# Patient Record
Sex: Female | Born: 1945 | Race: White | Hispanic: No | Marital: Married | Smoking: Never smoker
Health system: Southern US, Community
[De-identification: ages and names within clinical notes are randomized; demographics above are authoritative.]

## PROBLEM LIST (undated history)

## (undated) DIAGNOSIS — D3912 Neoplasm of uncertain behavior of left ovary: Secondary | ICD-10-CM

## (undated) DIAGNOSIS — Z961 Presence of intraocular lens: Secondary | ICD-10-CM

## (undated) DIAGNOSIS — C801 Malignant (primary) neoplasm, unspecified: Secondary | ICD-10-CM

## (undated) DIAGNOSIS — Z872 Personal history of diseases of the skin and subcutaneous tissue: Secondary | ICD-10-CM

## (undated) DIAGNOSIS — M159 Polyosteoarthritis, unspecified: Secondary | ICD-10-CM

## (undated) DIAGNOSIS — M8580 Other specified disorders of bone density and structure, unspecified site: Secondary | ICD-10-CM

## (undated) HISTORY — DX: Personal history of diseases of the skin and subcutaneous tissue: Z87.2

## (undated) HISTORY — PX: COLONOSCOPY: SHX174

## (undated) HISTORY — PX: ABDOMINAL HYSTERECTOMY: SHX81

## (undated) HISTORY — PX: BREAST EXCISIONAL BIOPSY: SUR124

## (undated) HISTORY — PX: CATARACT EXTRACTION: SUR2

---

## 2004-12-31 ENCOUNTER — Ambulatory Visit: Payer: Self-pay | Admitting: Gastroenterology

## 2005-05-28 ENCOUNTER — Ambulatory Visit: Payer: Self-pay | Admitting: Internal Medicine

## 2005-07-22 ENCOUNTER — Ambulatory Visit: Payer: Self-pay | Admitting: Internal Medicine

## 2006-07-23 ENCOUNTER — Ambulatory Visit: Payer: Self-pay | Admitting: Internal Medicine

## 2007-07-28 ENCOUNTER — Ambulatory Visit: Payer: Self-pay | Admitting: Internal Medicine

## 2007-12-30 DIAGNOSIS — C801 Malignant (primary) neoplasm, unspecified: Secondary | ICD-10-CM

## 2007-12-30 HISTORY — DX: Malignant (primary) neoplasm, unspecified: C80.1

## 2008-04-17 ENCOUNTER — Inpatient Hospital Stay: Payer: Self-pay | Admitting: Surgery

## 2008-04-26 DIAGNOSIS — D3910 Neoplasm of uncertain behavior of unspecified ovary: Secondary | ICD-10-CM | POA: Insufficient documentation

## 2008-08-21 ENCOUNTER — Ambulatory Visit: Payer: Self-pay | Admitting: Unknown Physician Specialty

## 2008-09-07 ENCOUNTER — Ambulatory Visit: Payer: Self-pay | Admitting: Internal Medicine

## 2009-09-11 ENCOUNTER — Ambulatory Visit: Payer: Self-pay | Admitting: Internal Medicine

## 2010-09-18 ENCOUNTER — Ambulatory Visit: Payer: Self-pay | Admitting: Internal Medicine

## 2011-07-18 ENCOUNTER — Ambulatory Visit: Payer: Self-pay | Admitting: Internal Medicine

## 2011-09-22 ENCOUNTER — Ambulatory Visit: Payer: Self-pay | Admitting: Internal Medicine

## 2012-09-22 ENCOUNTER — Ambulatory Visit: Payer: Self-pay | Admitting: Internal Medicine

## 2012-12-29 HISTORY — PX: BREAST BIOPSY: SHX20

## 2013-10-04 ENCOUNTER — Ambulatory Visit: Payer: Self-pay | Admitting: Internal Medicine

## 2013-10-10 ENCOUNTER — Ambulatory Visit: Payer: Self-pay | Admitting: Internal Medicine

## 2013-10-17 ENCOUNTER — Ambulatory Visit: Payer: Self-pay | Admitting: Internal Medicine

## 2014-07-15 DIAGNOSIS — M8580 Other specified disorders of bone density and structure, unspecified site: Secondary | ICD-10-CM | POA: Insufficient documentation

## 2014-10-09 ENCOUNTER — Ambulatory Visit: Payer: Self-pay | Admitting: Internal Medicine

## 2015-01-12 IMAGING — MG MM DIGITAL SCREENING BILAT W/ CAD
1 series · 4 of 4 positions shown · non-contrast
Comparison: Previous Exam(s)

REASON FOR EXAM: SCR MAMMO NO ORDER
COMMENTS:

PROCEDURE:     MAM - MAM DGTL SCRN MAM NO ORDER W/CAD  - October 04, 2013  [DATE]
CLINICAL DATA: Screening.
EXAM:
DIGITAL SCREENING BILATERAL MAMMOGRAM WITH CAD

[R CC · right · 4 of 4 slices shown]
[im 1/4]
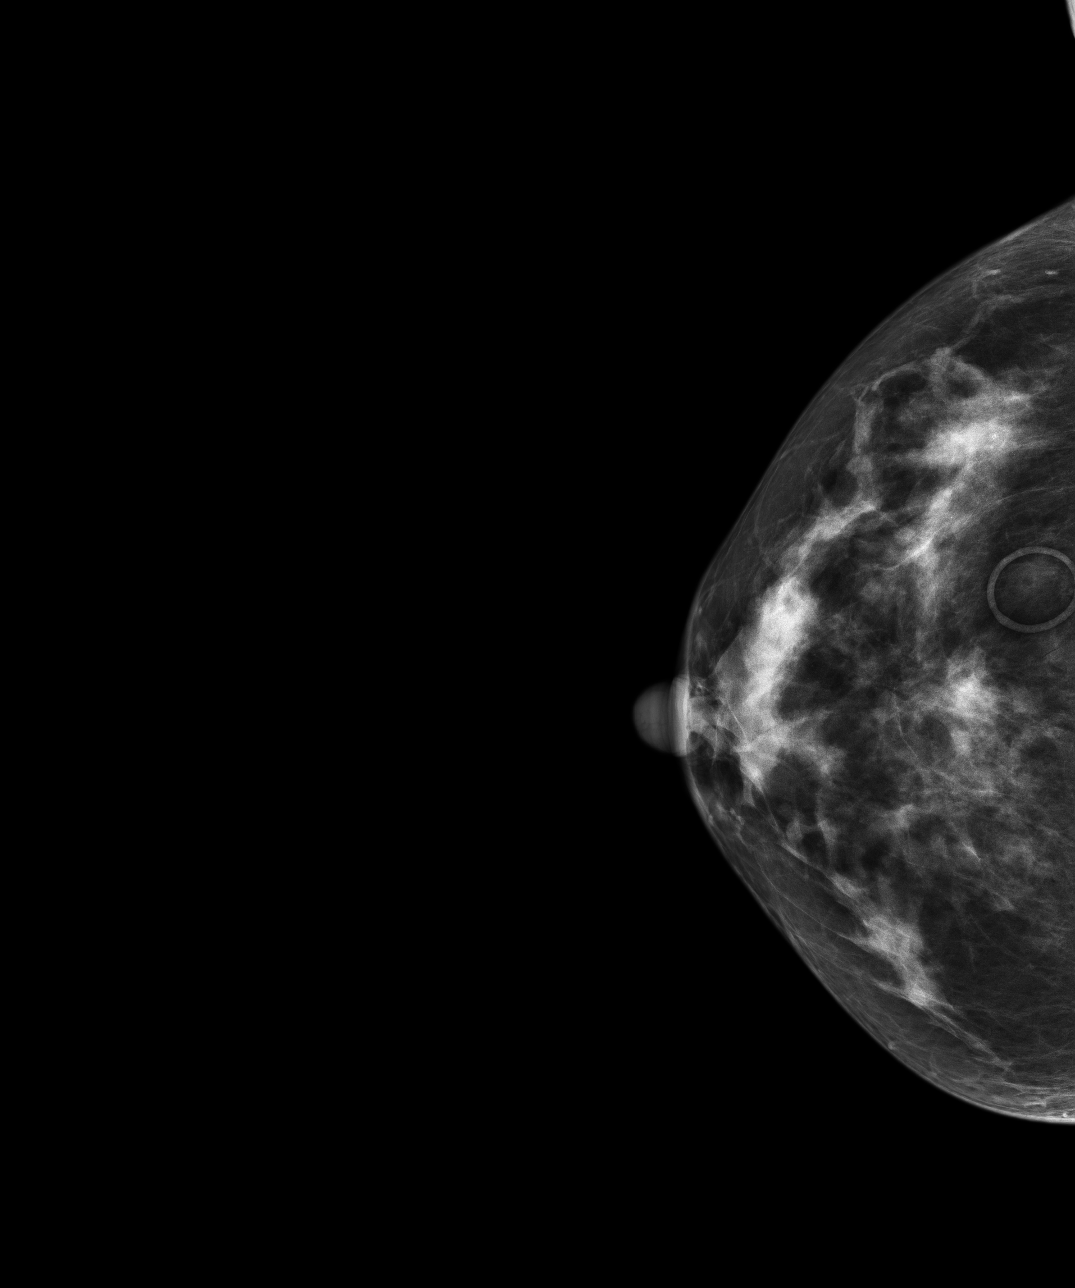
[im 2/4]
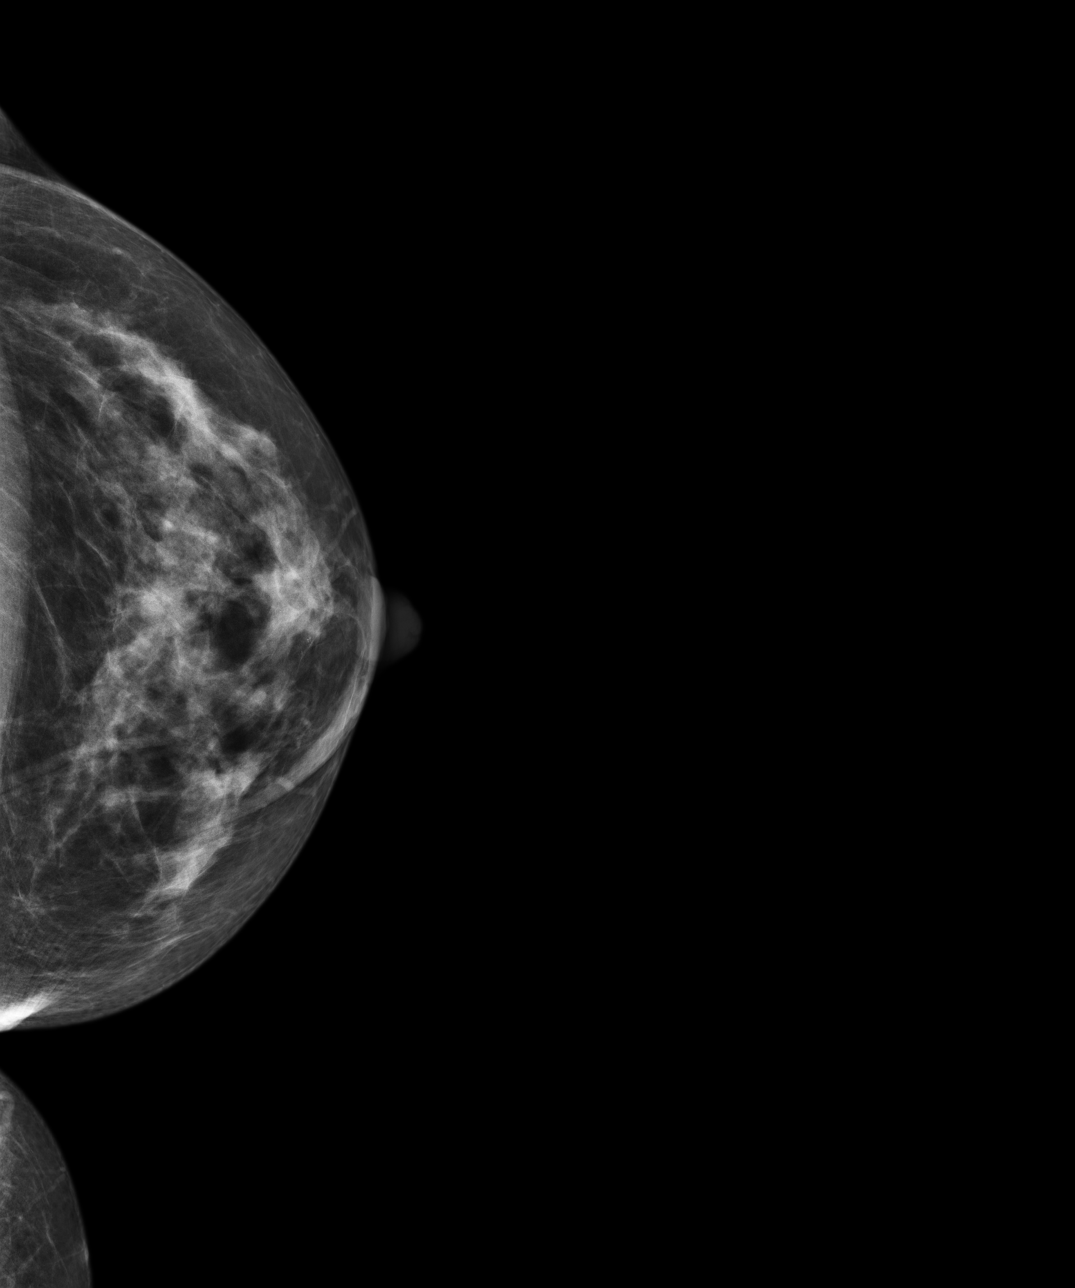
[im 3/4]
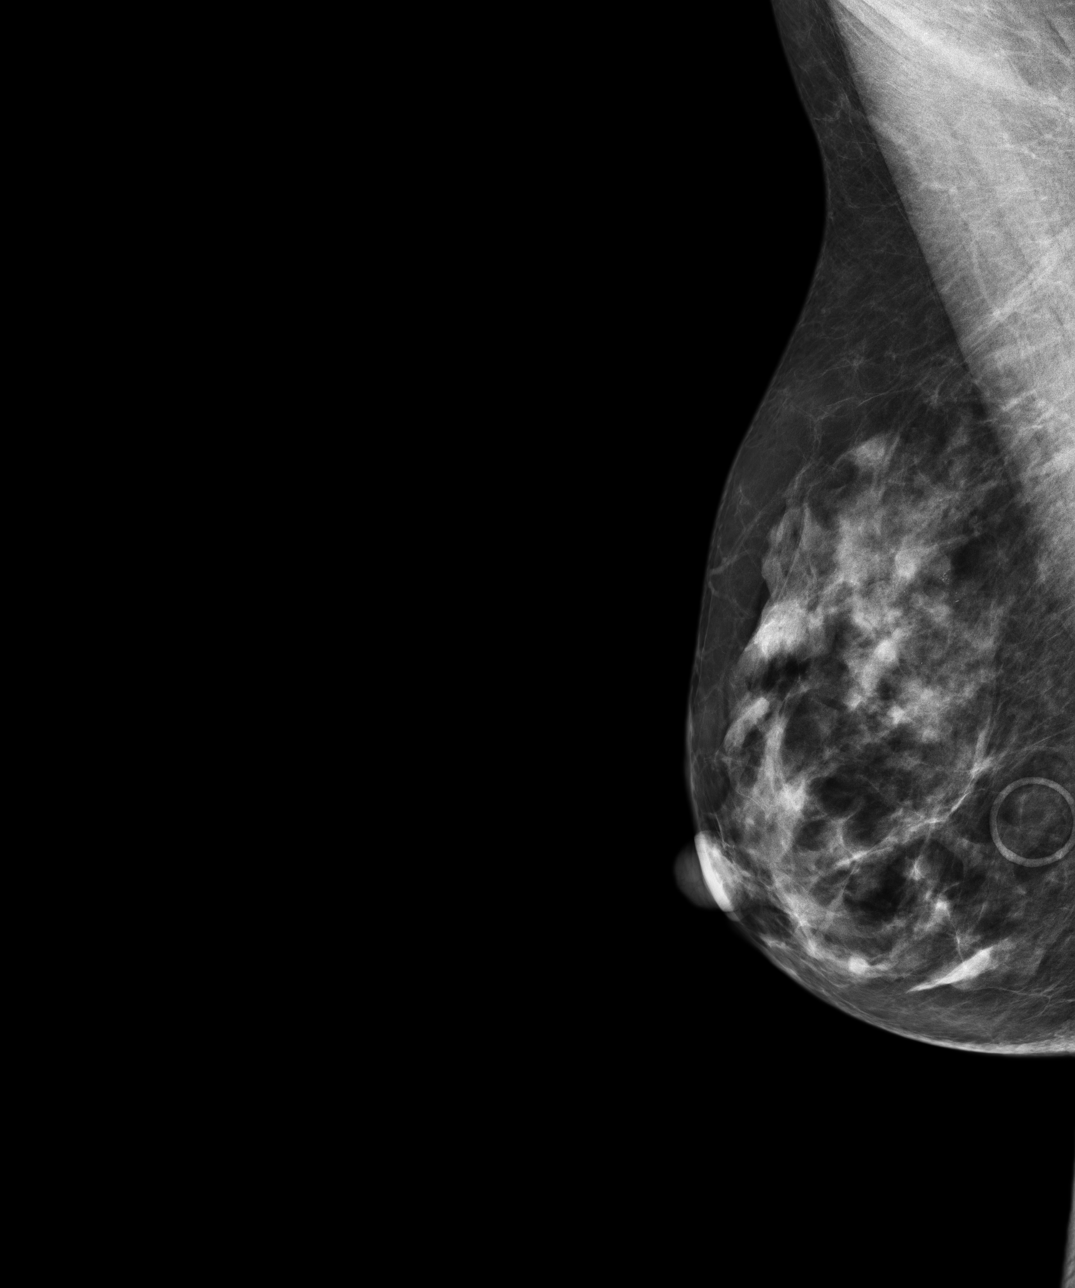
[im 4/4]
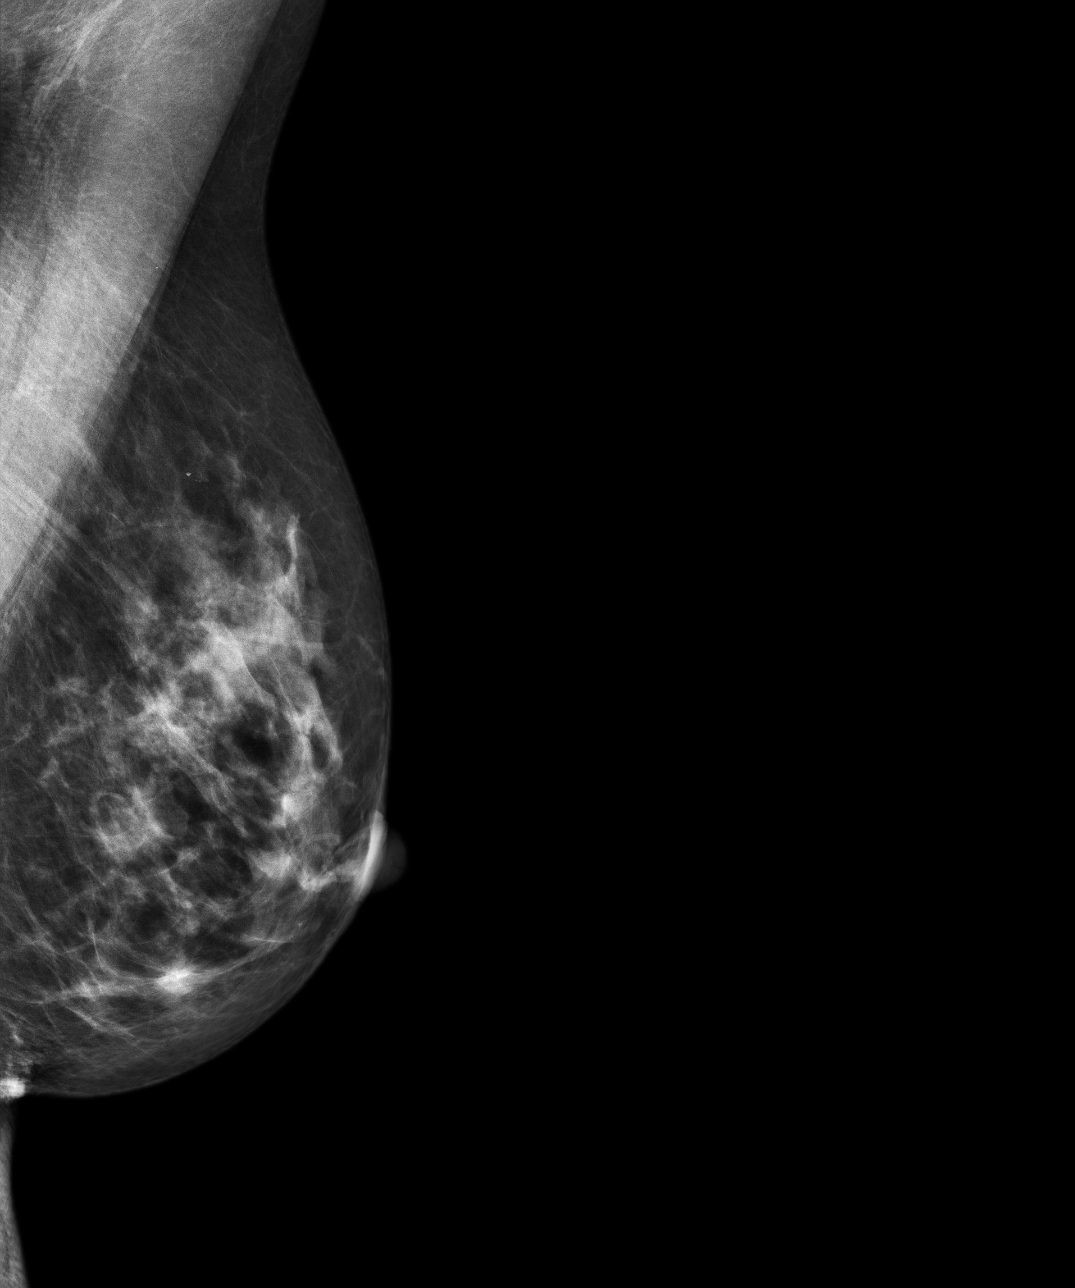

[4 of 4 positions shown; findings below may reference images not displayed]

ACR Breast Density Category c: The breasts are heterogeneously
dense, which may obscure small masses.
FINDINGS: In the right breast, calcifications warrant further evaluation with
magnified views. In the left breast, no mass or malignant type
calcifications are identified. Images were processed with CAD.
IMPRESSION: Further evaluation is suggested for calcifications in the right
breast.

RECOMMENDATION:
Diagnostic mammogram of the right breast. (Code:U5-5-LLH)

The patient will be contacted regarding the findings, and additional
imaging will be scheduled.

BI-RADS CATEGORY  0: Incomplete. Need additional imaging evaluation
and/or prior mammograms for comparison.

## 2015-01-18 IMAGING — MG MM ADDITIONAL VIEWS AT NO CHARGE
1 series · 2 of 2 positions shown · non-contrast
Comparison: 10/04/2013

REASON FOR EXAM: av rt calcifications
COMMENTS:

PROCEDURE:     MAM - MAM DIG ADDVIEWS RT SCR  - October 10, 2013  [DATE]
CLINICAL DATA: Calcifications right breast identified on recent
screening mammogram.
EXAM:
DIGITAL DIAGNOSTIC  BILATERAL MAMMOGRAM without CAD

[R CC · right · 2 of 2 slices shown]
[im 1/2]
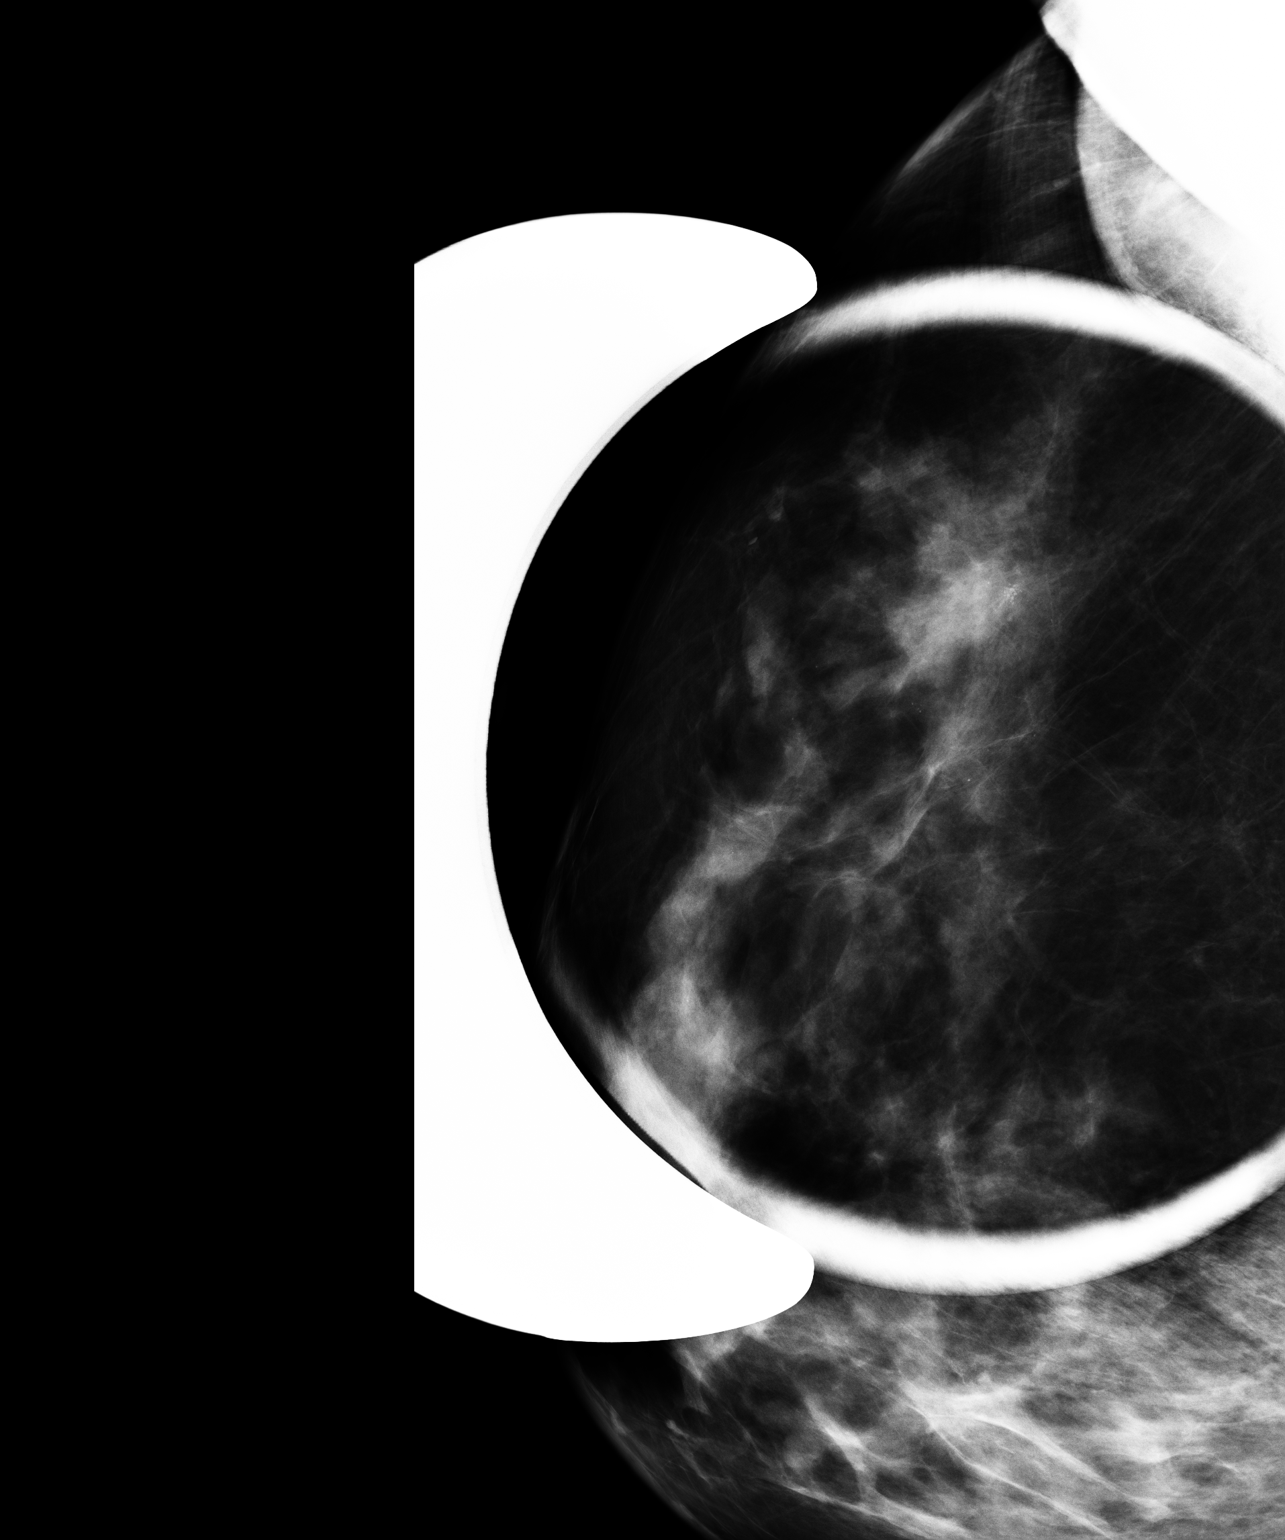
[im 2/2]
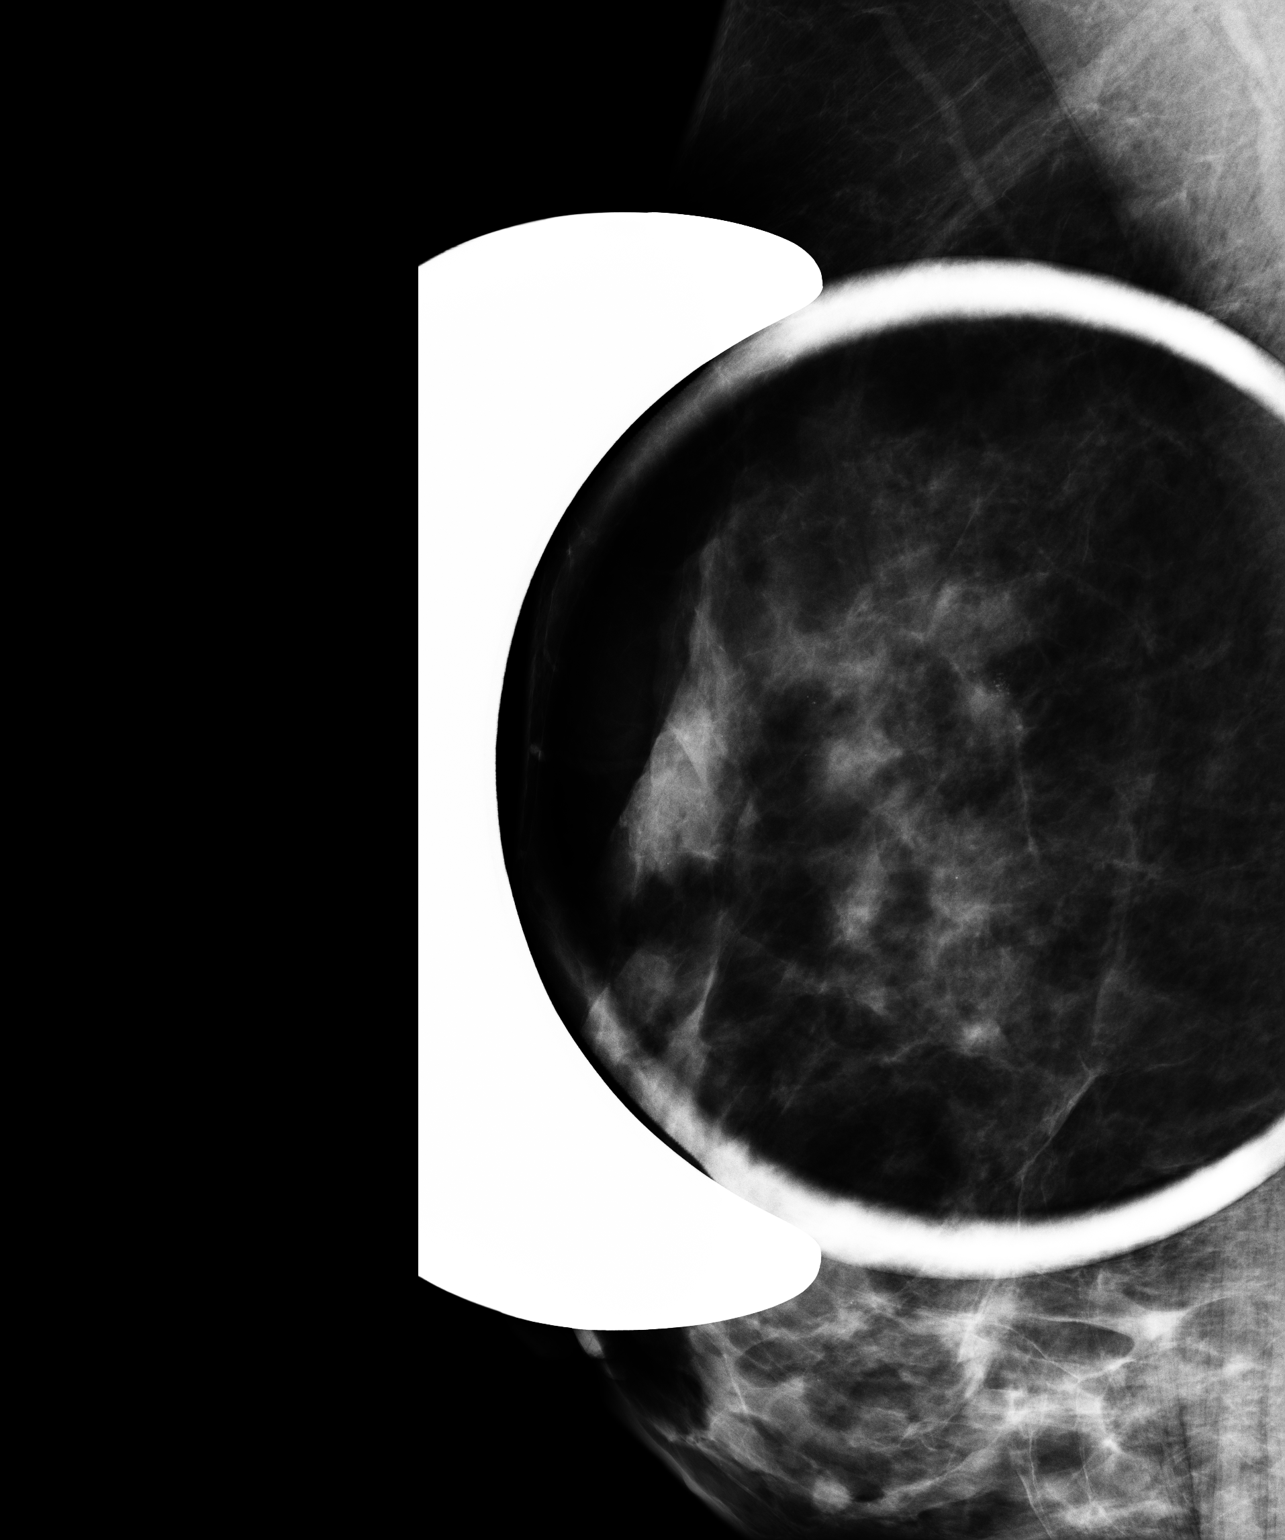

[2 of 2 positions shown; findings below may reference images not displayed]

ACR Breast Density Category c: The breasts are heterogeneously
dense, which may obscure small masses.
FINDINGS: Magnification views of the upper outer right breast show a tight
group of punctate, heterogeneous calcifications that span 3 x 4 x 3
mm.
IMPRESSION: 4 x 3 x 3 mm group of heterogeneous calcifications in the upper
outer right breast. Malignancy cannot be excluded. Stereotactic
biopsy is suggested.

RECOMMENDATION:
Stereotactic biopsy of the right breast. This was discussed with the
patient and was called to the physician's office by Fayad. Biopsy
has been scheduled for 10/17/2013 at 4 o'clock p.m.

I have discussed the findings and recommendations with the patient.
Results were also provided in writing at the conclusion of the
visit. If applicable, a reminder letter will be sent to the patient
regarding the next appointment.

BI-RADS CATEGORY  4: Suspicious abnormality - biopsy should be
considered.

## 2015-08-01 ENCOUNTER — Other Ambulatory Visit: Payer: Self-pay | Admitting: Internal Medicine

## 2015-08-01 DIAGNOSIS — Z1231 Encounter for screening mammogram for malignant neoplasm of breast: Secondary | ICD-10-CM

## 2015-10-15 ENCOUNTER — Ambulatory Visit
Admission: RE | Admit: 2015-10-15 | Discharge: 2015-10-15 | Disposition: A | Discharge status: Home / Self Care | Payer: BLUE CROSS/BLUE SHIELD | Source: Ambulatory Visit | Attending: Internal Medicine | Admitting: Internal Medicine

## 2015-10-15 DIAGNOSIS — Z1231 Encounter for screening mammogram for malignant neoplasm of breast: Secondary | ICD-10-CM | POA: Diagnosis present

## 2015-10-15 HISTORY — DX: Malignant (primary) neoplasm, unspecified: C80.1

## 2016-04-28 DIAGNOSIS — Z961 Presence of intraocular lens: Secondary | ICD-10-CM | POA: Insufficient documentation

## 2016-05-19 DIAGNOSIS — Z961 Presence of intraocular lens: Secondary | ICD-10-CM | POA: Insufficient documentation

## 2016-11-18 ENCOUNTER — Inpatient Hospital Stay
Admission: EM | Admit: 2016-11-18 | Discharge: 2016-11-23 | DRG: 419 | Disposition: A | Discharge status: Home / Self Care | Payer: BLUE CROSS/BLUE SHIELD | Attending: Surgery | Admitting: Surgery

## 2016-11-18 ENCOUNTER — Emergency Department: Payer: BLUE CROSS/BLUE SHIELD

## 2016-11-18 DIAGNOSIS — Z90710 Acquired absence of both cervix and uterus: Secondary | ICD-10-CM

## 2016-11-18 DIAGNOSIS — K819 Cholecystitis, unspecified: Secondary | ICD-10-CM

## 2016-11-18 DIAGNOSIS — K810 Acute cholecystitis: Secondary | ICD-10-CM | POA: Diagnosis not present

## 2016-11-18 DIAGNOSIS — K8062 Calculus of gallbladder and bile duct with acute cholecystitis without obstruction: Principal | ICD-10-CM | POA: Diagnosis present

## 2016-11-18 DIAGNOSIS — K838 Other specified diseases of biliary tract: Secondary | ICD-10-CM

## 2016-11-18 DIAGNOSIS — Z8543 Personal history of malignant neoplasm of ovary: Secondary | ICD-10-CM

## 2016-11-18 DIAGNOSIS — R109 Unspecified abdominal pain: Secondary | ICD-10-CM

## 2016-11-18 DIAGNOSIS — K9189 Other postprocedural complications and disorders of digestive system: Secondary | ICD-10-CM

## 2016-11-18 LAB — COMPREHENSIVE METABOLIC PANEL
ALK PHOS: 66 U/L (ref 38–126)
ALT: 68 U/L — ABNORMAL HIGH (ref 14–54)
ANION GAP: 10 (ref 5–15)
AST: 83 U/L — ABNORMAL HIGH (ref 15–41)
Albumin: 3.9 g/dL (ref 3.5–5.0)
BILIRUBIN TOTAL: 1.3 mg/dL — AB (ref 0.3–1.2)
BUN: 17 mg/dL (ref 6–20)
CALCIUM: 9.3 mg/dL (ref 8.9–10.3)
CO2: 23 mmol/L (ref 22–32)
Chloride: 98 mmol/L — ABNORMAL LOW (ref 101–111)
Creatinine, Ser: 0.68 mg/dL (ref 0.44–1.00)
GFR calc non Af Amer: >60 mL/min (ref >60)
Glucose, Bld: 131 mg/dL — ABNORMAL HIGH (ref 65–99)
Potassium: 3.5 mmol/L (ref 3.5–5.1)
Sodium: 131 mmol/L — ABNORMAL LOW (ref 135–145)
TOTAL PROTEIN: 7.5 g/dL (ref 6.5–8.1)

## 2016-11-18 LAB — URINALYSIS COMPLETE WITH MICROSCOPIC (ARMC ONLY)
BILIRUBIN URINE: NEGATIVE
Bacteria, UA: NONE SEEN
Glucose, UA: 50 mg/dL — AB
KETONES UR: NEGATIVE mg/dL
NITRITE: NEGATIVE
PH: 5 (ref 5.0–8.0)
Protein, ur: 30 mg/dL — AB
Specific Gravity, Urine: 1.021 (ref 1.005–1.030)

## 2016-11-18 LAB — CBC
HCT: 44 % (ref 35.0–47.0)
HEMOGLOBIN: 15.7 g/dL (ref 12.0–16.0)
MCH: 31.8 pg (ref 26.0–34.0)
MCHC: 35.7 g/dL (ref 32.0–36.0)
MCV: 89 fL (ref 80.0–100.0)
Platelets: 193 10*3/uL (ref 150–440)
RBC: 4.95 MIL/uL (ref 3.80–5.20)
RDW: 13.3 % (ref 11.5–14.5)
WBC: 32.8 10*3/uL — AB (ref 3.6–11.0)

## 2016-11-18 LAB — SURGICAL PCR SCREEN
MRSA, PCR: NEGATIVE
Staphylococcus aureus: NEGATIVE

## 2016-11-18 LAB — LIPASE, BLOOD: Lipase: 12 U/L (ref 11–51)

## 2016-11-18 MED ORDER — ONDANSETRON HCL 4 MG/2ML IJ SOLN
4.0000 mg | Freq: Once | INTRAMUSCULAR | Status: AC
  Administered 2016-11-18: 4 mg via INTRAVENOUS
  Filled 2016-11-18: qty 2

## 2016-11-18 MED ORDER — HYDROMORPHONE HCL 1 MG/ML IJ SOLN
0.5000 mg | Freq: Once | INTRAMUSCULAR | Status: AC
  Administered 2016-11-18: 0.5 mg via INTRAVENOUS
  Filled 2016-11-18: qty 1

## 2016-11-18 MED ORDER — MORPHINE SULFATE (PF) 4 MG/ML IV SOLN
2.0000 mg | INTRAVENOUS | Status: DC | PRN
  Administered 2016-11-18 – 2016-11-20 (×10): 2 mg via INTRAVENOUS
  Filled 2016-11-18 (×10): qty 1

## 2016-11-18 MED ORDER — PIPERACILLIN-TAZOBACTAM 3.375 G IVPB 30 MIN
3.3750 g | Freq: Once | INTRAVENOUS | Status: AC
  Administered 2016-11-18: 3.375 g via INTRAVENOUS
  Filled 2016-11-18: qty 50

## 2016-11-18 MED ORDER — IOPAMIDOL (ISOVUE-300) INJECTION 61%
30.0000 mL | Freq: Once | INTRAVENOUS | Status: AC | PRN
  Administered 2016-11-18: 30 mL via ORAL

## 2016-11-18 MED ORDER — DEXTROSE-NACL 5-0.9 % IV SOLN
INTRAVENOUS | Status: DC
  Administered 2016-11-18 – 2016-11-19 (×3): via INTRAVENOUS

## 2016-11-18 MED ORDER — HEPARIN SODIUM (PORCINE) 5000 UNIT/ML IJ SOLN
5000.0000 [IU] | Freq: Three times a day (TID) | INTRAMUSCULAR | Status: DC
  Administered 2016-11-18 – 2016-11-23 (×13): 5000 [IU] via SUBCUTANEOUS
  Filled 2016-11-18 (×13): qty 1

## 2016-11-18 MED ORDER — MORPHINE SULFATE (PF) 4 MG/ML IV SOLN
4.0000 mg | Freq: Once | INTRAVENOUS | Status: AC
  Administered 2016-11-18: 4 mg via INTRAVENOUS
  Filled 2016-11-18: qty 1

## 2016-11-18 MED ORDER — ONDANSETRON HCL 4 MG PO TABS
4.0000 mg | ORAL_TABLET | Freq: Four times a day (QID) | ORAL | Status: DC | PRN
  Administered 2016-11-20: 4 mg via ORAL
  Filled 2016-11-18 (×2): qty 1

## 2016-11-18 MED ORDER — ONDANSETRON HCL 4 MG/2ML IJ SOLN
4.0000 mg | Freq: Four times a day (QID) | INTRAMUSCULAR | Status: DC | PRN
  Administered 2016-11-18 – 2016-11-21 (×5): 4 mg via INTRAVENOUS
  Filled 2016-11-18 (×5): qty 2

## 2016-11-18 MED ORDER — SODIUM CHLORIDE 0.9 % IV BOLUS (SEPSIS)
1000.0000 mL | Freq: Once | INTRAVENOUS | Status: AC
  Administered 2016-11-18: 1000 mL via INTRAVENOUS

## 2016-11-18 MED ORDER — SODIUM CHLORIDE 0.9 % IV SOLN
3.0000 g | Freq: Four times a day (QID) | INTRAVENOUS | Status: DC
  Administered 2016-11-18 – 2016-11-23 (×20): 3 g via INTRAVENOUS
  Filled 2016-11-18 (×22): qty 3

## 2016-11-18 NOTE — H&P (Signed)
Lori Thornton is an 70 y.o. female.    Chief Complaint: Right upper quadrant pain  HPI: This patient with a personally episode of right upper quadrant pain is*last night after a fatty meal she's had some nausea but no emesis no fevers or chills no jaundice or acholic stools she points to the right upper quadrant as source of her pain.  Past Medical History:  Diagnosis Date  . Cancer [facility]) 2009   ovarian    Past Surgical History:  Procedure Laterality Date  . ABDOMINAL HYSTERECTOMY    . BREAST BIOPSY Right 2014   stereo bx. negative  . BREAST EXCISIONAL BIOPSY Right 1974 and 1985   benign    No family history on file. Social History:  reports that she has never smoked. She has never used smokeless tobacco. She reports that she does not drink alcohol. Her drug history is not on file.  Allergies: No Known Allergies   (Not in a hospital admission)   Review of Systems  Constitutional: Negative for chills, fever and weight loss.  HENT: Negative.   Eyes: Negative.   Respiratory: Negative.   Cardiovascular: Negative.   Gastrointestinal: Positive for abdominal pain and nausea. Negative for blood in stool, constipation, diarrhea, heartburn, melena and vomiting.  Genitourinary: Negative.   Musculoskeletal: Negative.   Skin: Negative.   Neurological: Negative.   Endo/Heme/Allergies: Negative.   Psychiatric/Behavioral: Negative.      Physical Exam:  BP 109/61   Pulse 83   Temp 98.1 F (36.7 C) (Oral)   Resp 13   Ht '5\' 5"'$  (1.651 m)   Wt 125 lb (56.7 kg)   SpO2 97%   BMI 20.80 kg/m   Physical Exam  Constitutional: She is oriented to person, place, and time. No distress.  HENT:  Head: Normocephalic.  Mouth/Throat: No oropharyngeal exudate.  Eyes: Pupils are equal, round, and reactive to light. Right eye exhibits no discharge. Left eye exhibits no discharge. No scleral icterus.  Neck: Normal range of motion.  Abdominal: Soft. She exhibits no distension. There is  tenderness. There is no rebound and no guarding.  Tender in the right upper quadrant with a questionable Murphy sign  Musculoskeletal: Normal range of motion. She exhibits no edema or tenderness.  Lymphadenopathy:    She has no cervical adenopathy.  Neurological: She is alert and oriented to person, place, and time.  Skin: Skin is warm and dry. No rash noted. She is not diaphoretic. No erythema.  Psychiatric: Mood and affect normal.  Vitals reviewed.       Results for orders placed or performed during the hospital encounter of 11/18/16 (from the past 48 hour(s))  Lipase, blood     Status: None   Collection Time: 11/18/16  9:16 AM  Result Value Ref Range   Lipase 12 11 - 51 U/L  Comprehensive metabolic panel     Status: Abnormal   Collection Time: 11/18/16  9:16 AM  Result Value Ref Range   Sodium 131 (L) 135 - 145 mmol/L   Potassium 3.5 3.5 - 5.1 mmol/L   Chloride 98 (L) 101 - 111 mmol/L   CO2 23 22 - 32 mmol/L   Glucose, Bld 131 (H) 65 - 99 mg/dL   BUN 17 6 - 20 mg/dL   Creatinine, Ser 0.68 0.44 - 1.00 mg/dL   Calcium 9.3 8.9 - 10.3 mg/dL   Total Protein 7.5 6.5 - 8.1 g/dL   Albumin 3.9 3.5 - 5.0 g/dL   AST 83 (H)  15 - 41 U/L   ALT 68 (H) 14 - 54 U/L   Alkaline Phosphatase 66 38 - 126 U/L   Total Bilirubin 1.3 (H) 0.3 - 1.2 mg/dL   GFR calc non Af Amer >60 >60 mL/min   GFR calc Af Amer >60 >60 mL/min    Comment: (NOTE) The eGFR has been calculated using the CKD EPI equation. This calculation has not been validated in all clinical situations. eGFR's persistently <60 mL/min signify possible Chronic Kidney Disease.    Anion gap 10 5 - 15  CBC     Status: Abnormal   Collection Time: 11/18/16  9:16 AM  Result Value Ref Range   WBC 32.8 (H) 3.6 - 11.0 K/uL   RBC 4.95 3.80 - 5.20 MIL/uL   Hemoglobin 15.7 12.0 - 16.0 g/dL    Comment: RESULT REPEATED AND VERIFIED   HCT 44.0 35.0 - 47.0 %   MCV 89.0 80.0 - 100.0 fL   MCH 31.8 26.0 - 34.0 pg   MCHC 35.7 32.0 - 36.0 g/dL    RDW 13.3 11.5 - 14.5 %   Platelets 193 150 - 440 K/uL  Urinalysis complete, with microscopic     Status: Abnormal   Collection Time: 11/18/16  9:16 AM  Result Value Ref Range   Color, Urine YELLOW (A) YELLOW   APPearance HAZY (A) CLEAR   Glucose, UA 50 (A) NEGATIVE mg/dL   Bilirubin Urine NEGATIVE NEGATIVE   Ketones, ur NEGATIVE NEGATIVE mg/dL   Specific Gravity, Urine 1.021 1.005 - 1.030   Hgb urine dipstick 1+ (A) NEGATIVE   pH 5.0 5.0 - 8.0   Protein, ur 30 (A) NEGATIVE mg/dL   Nitrite NEGATIVE NEGATIVE   Leukocytes, UA 2+ (A) NEGATIVE   RBC / HPF 0-5 0 - 5 RBC/hpf   WBC, UA 6-30 0 - 5 WBC/hpf   Bacteria, UA NONE SEEN NONE SEEN   Squamous Epithelial / LPF 0-5 (A) NONE SEEN   Mucous PRESENT    Hyaline Casts, UA PRESENT    US Abdomen Limited Ruq  Result Date: 11/18/2016 CLINICAL DATA:  Abdominal pain EXAM: US ABDOMEN LIMITED - RIGHT UPPER QUADRANT COMPARISON:  CT abdomen and pelvis April 18, 2008 FINDINGS: Gallbladder: Within the gallbladder, there are multiple echogenic foci which move and shadow consistent with gallstones. Largest gallstone measures 1.9 cm in length. A 9 mm polyp is also noted along the wall of gallbladder anteriorly. There is gallbladder wall thickening and edema with mild pericholecystic fluid. No sonographic Murphy sign noted by sonographer. The patient was given morphine which could mask a positive Murphy sign. Common bile duct: Diameter: 6 mm. No extrahepatic biliary duct dilatation there appears to be mild intrahepatic biliary duct dilatation. Liver: There is a cyst in the medial segment left lobe of the liver measuring 2 x 2 x 2.7 cm. There is an adjacent cyst measuring 1.0 x 0.7 x 0.7 cm in this area. A third nearby cyst measures 1.5 x 1.8 x 1.9 cm. In the anterior segment right lobe of the liver, there is a cyst containing minimal septations measured 3.3 x 3.2 x 3.2 cm. Within normal limits in parenchymal echogenicity. IMPRESSION: Cholelithiasis with probable  9 mm polyp along the anterior wall as well. There is gallbladder wall thickening and edema or pericholecystic fluid. These are changes indicative of acute cholecystitis. There is mild intrahepatic biliary duct dilatation. Common bile duct is not dilated. There are several cysts in the liver. Electronically Signed   By: Gwyndolyn Saxon  Jasmine December III M.D.   On: 11/18/2016 11:13     Assessment/Plan  This patient with likely acute cholecystitis the plan would be to admit her to the hospital start IV hydration and antibiotics antiemetics and analgesics. Plan then would be to proceed with laparoscopic cholecystectomy for control of her symptoms. The rationale for this been discussed the options of observation reviewed the procedure itself was discussed as well as the potential for cholangiography and this being performed laparoscopically versus an open procedure. We discussed risks of bleeding infection recurrence of symptoms failure to resolve her symptoms conversion to an open procedure bile duct damage bile duct leak retained common bile duct stone she understood and agreed to proceed as did her husband. This is discussed with the patient her husband as well as with the emergency room physician  Florene Glen, MD, FACS

## 2016-11-18 NOTE — ED Notes (Signed)
Pharm called to send up unasyn

## 2016-11-18 NOTE — ED Triage Notes (Signed)
Pt c/o having RUQ that has moved into the RLQ since last with N/V.Lori Thornton

## 2016-11-18 NOTE — ED Notes (Signed)
Cont fluids not available at this time, pharm told to call supply chain. Supply chain called with no answer

## 2016-11-18 NOTE — ED Provider Notes (Signed)
[facility] Emergency Department Provider Note  ____________________________________________  Time seen: Approximately 10:10 AM  I have reviewed the triage vital signs and the nursing notes.   HISTORY  Chief Complaint Abdominal Pain   HPI Lori Thornton is a 70 y.o. female the history of ovarian cancer status post hysterectomy, bilateral oophorectomy, and appendectomy in 2009 who presents for evaluation of right lower quadrant abdominal pain. Patient reports that the pain started yesterday evening in the right upper quadrant and then this morning migrated to the right lower quadrant. The pain is constant, sharp, 6/10, located in the right lower quadrant and nonradiating. Patient denies nausea or vomiting, anorexia, vaginal discharge, dysuria or hematuria, chest pain or shortness of breath, coughing, fever, chills. Patient denies ever having this pain in the past.  Past Medical History:  Diagnosis Date  . Cancer [facility]) 2009   ovarian    Patient Active Problem List   Diagnosis Date Noted  . Acute cholecystitis 11/18/2016  . Cholecystitis     Past Surgical History:  Procedure Laterality Date  . ABDOMINAL HYSTERECTOMY    . BREAST BIOPSY Right 2014   stereo bx. negative  . BREAST EXCISIONAL BIOPSY Right 1974 and 1985   benign    Prior to Admission medications   Medication Sig Start Date End Date Taking? Authorizing Provider  calcium-vitamin D (OSCAL 500/200 D-3) 500-200 MG-UNIT tablet Take 1 tablet by mouth daily.   Yes Historical Provider, MD  Cetirizine HCl 10 MG CAPS Take 1 tablet by mouth daily as needed.   Yes Historical Provider, MD  ibuprofen (ADVIL,MOTRIN) 200 MG tablet Take 400 mg by mouth every 6 (six) hours as needed.   Yes Historical Provider, MD  multivitamin (ONE-A-DAY MEN'S) TABS tablet Take 1 tablet by mouth daily.   Yes Historical Provider, MD    Allergies Patient has no known allergies.  No family history on file.  Social  History Social History  Substance Use Topics  . Smoking status: Never Smoker  . Smokeless tobacco: Never Used  . Alcohol use No    Review of Systems  Constitutional: Negative for fever. Eyes: Negative for visual changes. ENT: Negative for sore throat. Neck: No neck pain  Cardiovascular: Negative for chest pain. Respiratory: Negative for shortness of breath. Gastrointestinal: + RLQ abdominal pain. No vomiting or diarrhea. Genitourinary: Negative for dysuria. Musculoskeletal: Negative for back pain. Skin: Negative for rash. Neurological: Negative for headaches, weakness or numbness. Psych: No SI or HI  ____________________________________________   PHYSICAL EXAM:  VITAL SIGNS: ED Triage Vitals  Enc Vitals Group     BP 11/18/16 0848 114/73     Pulse Rate 11/18/16 0848 98     Resp 11/18/16 0848 16     Temp 11/18/16 0848 98.1 F (36.7 C)     Temp Source 11/18/16 0848 Oral     SpO2 11/18/16 0848 98 %     Weight 11/18/16 0848 125 lb (56.7 kg)     Height 11/18/16 0848 5\' 5"  (1.651 m)     Head Circumference --      Peak Flow --      Pain Score 11/18/16 0849 8     Pain Loc --      Pain Edu? --      Excl. in [place]? --     Constitutional: Alert and oriented. Well appearing and in no apparent distress. HEENT:      Head: Normocephalic and atraumatic.  Eyes: Conjunctivae are normal. Sclera is non-icteric. EOMI. PERRL      Mouth/Throat: Mucous membranes are moist.       Neck: Supple with no signs of meningismus. Cardiovascular: Regular rate and rhythm. No murmurs, gallops, or rubs. 2+ symmetrical distal pulses are present in all extremities. No JVD. Respiratory: Normal respiratory effort. Lungs are clear to auscultation bilaterally. No wheezes, crackles, or rhonchi.  Gastrointestinal: Soft, ttp on the RLQ with localized guarding, no rebound Genitourinary: No CVA tenderness. Musculoskeletal: Nontender with normal range of motion in all extremities. No edema, cyanosis, or  erythema of extremities. Neurologic: Normal speech and language. Face is symmetric. Moving all extremities. No gross focal neurologic deficits are appreciated. Skin: Skin is warm, dry and intact. No rash noted. Psychiatric: Mood and affect are normal. Speech and behavior are normal.  ____________________________________________   LABS (all labs ordered are listed, but only abnormal results are displayed)  Labs Reviewed  COMPREHENSIVE METABOLIC PANEL - Abnormal; Notable for the following:       Result Value   Sodium 131 (*)    Chloride 98 (*)    Glucose, Bld 131 (*)    AST 83 (*)    ALT 68 (*)    Total Bilirubin 1.3 (*)    All other components within normal limits  CBC - Abnormal; Notable for the following:    WBC 32.8 (*)    All other components within normal limits  URINALYSIS COMPLETEWITH MICROSCOPIC ([facility] ONLY) - Abnormal; Notable for the following:    Color, Urine YELLOW (*)    APPearance HAZY (*)    Glucose, UA 50 (*)    Hgb urine dipstick 1+ (*)    Protein, ur 30 (*)    Leukocytes, UA 2+ (*)    Squamous Epithelial / LPF 0-5 (*)    All other components within normal limits  URINE CULTURE  LIPASE, BLOOD   ____________________________________________  EKG  ED ECG REPORT I, Rudene Re, the attending physician, personally viewed and interpreted this ECG.  Normal sinus rhythm, rate of 92, normal intervals, normal axis, no ST elevations or depressions, T-wave inversion in lead 3. ____________________________________________  RADIOLOGY  RUQ [place]:  Cholelithiasis with probable 9 mm polyp along the anterior wall as well. There is gallbladder wall thickening and edema or pericholecystic fluid. These are changes indicative of acute cholecystitis. There is mild intrahepatic biliary duct dilatation. Common bile duct is not dilated. There are several cysts in the liver. ____________________________________________   PROCEDURES  Procedure(s) performed:  None Procedures Critical Care performed: yes  CRITICAL CARE Performed by: Rudene Re  ?  Total critical care time: 35 min  Critical care time was exclusive of separately billable procedures and treating other patients.  Critical care was necessary to treat or prevent imminent or life-threatening deterioration.  Critical care was time spent personally by me on the following activities: development of treatment plan with patient and/or surrogate as well as nursing, discussions with consultants, evaluation of patient's response to treatment, examination of patient, obtaining history from patient or surrogate, ordering and performing treatments and interventions, ordering and review of laboratory studies, ordering and review of radiographic studies, pulse oximetry and re-evaluation of patient's condition.  ____________________________________________   INITIAL IMPRESSION / ASSESSMENT AND PLAN / ED COURSE  70 y.o. female the history of ovarian cancer status post hysterectomy, bilateral oophorectomy, and appendectomy in 2009 who presents for evaluation of right lower quadrant abdominal pain. Vital signs are within normal limits. Patient is tender to palpation  on the right lower quadrant localized guarding, remainder of her abdominal exam is within normal limits. Blood work showing leukocytosis with white count of 33,000, mildly elevated LFTs with AST of 83, ALT of 68, T bili of 1.3. Patient will be sent for right upper quadrant ultrasound and if that is negative for gallbladder pathology she'll be sent for CT scan of her abdomen.  Clinical Course as of Nov 18 1346  Tue Nov 18, 2016  1118 [place] concerning for cholecystitis. Dr. Burt Knack consulted for op management. Patient remains stable. Zosyn ordered.   [CV]    Clinical Course User Index [CV] Rudene Re, MD    Pertinent labs & imaging results that were available during my care of the patient were reviewed by me and considered in  my medical decision making (see chart for details).    ____________________________________________   FINAL CLINICAL IMPRESSION(S) / ED DIAGNOSES  Final diagnoses:  Abdominal pain  Cholecystitis      NEW MEDICATIONS STARTED DURING THIS VISIT:  New Prescriptions   No medications on file     Note:  This document was prepared using Dragon voice recognition software and may include unintentional dictation errors.    Rudene Re, MD 11/18/16 1348

## 2016-11-18 NOTE — ED Notes (Signed)
Patient is complaining of right lower quadrant pain that began during the night and woke her up from sleep.  Area is tender to touch.  Patient denies vomiting and diarrhea.  Patient is in no obvious distress at this time.

## 2016-11-19 ENCOUNTER — Inpatient Hospital Stay: Payer: BLUE CROSS/BLUE SHIELD

## 2016-11-19 ENCOUNTER — Encounter
Admission: EM | Disposition: A | Discharge status: Home / Self Care | Payer: Self-pay | Source: Home / Self Care | Attending: Surgery

## 2016-11-19 ENCOUNTER — Inpatient Hospital Stay: Payer: BLUE CROSS/BLUE SHIELD | Admitting: Anesthesiology

## 2016-11-19 DIAGNOSIS — K810 Acute cholecystitis: Secondary | ICD-10-CM

## 2016-11-19 HISTORY — PX: CHOLECYSTECTOMY: SHX55

## 2016-11-19 LAB — COMPREHENSIVE METABOLIC PANEL
ALBUMIN: 2.9 g/dL — AB (ref 3.5–5.0)
ALT: 208 U/L — ABNORMAL HIGH (ref 14–54)
ANION GAP: 6 (ref 5–15)
AST: 152 U/L — ABNORMAL HIGH (ref 15–41)
Alkaline Phosphatase: 111 U/L (ref 38–126)
BUN: 19 mg/dL (ref 6–20)
CHLORIDE: 105 mmol/L (ref 101–111)
CO2: 24 mmol/L (ref 22–32)
Calcium: 8.1 mg/dL — ABNORMAL LOW (ref 8.9–10.3)
Creatinine, Ser: 0.56 mg/dL (ref 0.44–1.00)
GFR calc non Af Amer: >60 mL/min (ref >60)
Glucose, Bld: 132 mg/dL — ABNORMAL HIGH (ref 65–99)
POTASSIUM: 3.1 mmol/L — AB (ref 3.5–5.1)
SODIUM: 135 mmol/L (ref 135–145)
Total Bilirubin: 2 mg/dL — ABNORMAL HIGH (ref 0.3–1.2)
Total Protein: 5.9 g/dL — ABNORMAL LOW (ref 6.5–8.1)

## 2016-11-19 LAB — CBC
HEMATOCRIT: 39 % (ref 35.0–47.0)
HEMOGLOBIN: 13.8 g/dL (ref 12.0–16.0)
MCH: 31.4 pg (ref 26.0–34.0)
MCHC: 35.3 g/dL (ref 32.0–36.0)
MCV: 88.8 fL (ref 80.0–100.0)
Platelets: 173 10*3/uL (ref 150–440)
RBC: 4.39 MIL/uL (ref 3.80–5.20)
RDW: 13.4 % (ref 11.5–14.5)
WBC: 32.8 10*3/uL — ABNORMAL HIGH (ref 3.6–11.0)

## 2016-11-19 LAB — URINE CULTURE

## 2016-11-19 SURGERY — LAPAROSCOPIC CHOLECYSTECTOMY WITH INTRAOPERATIVE CHOLANGIOGRAM
Anesthesia: General | Wound class: Clean Contaminated

## 2016-11-19 MED ORDER — ONDANSETRON HCL 4 MG/2ML IJ SOLN
4.0000 mg | Freq: Once | INTRAMUSCULAR | Status: DC | PRN
Start: 2016-11-19 — End: 2016-11-19

## 2016-11-19 MED ORDER — IOTHALAMATE MEGLUMINE 60 % INJ SOLN
INTRAMUSCULAR | Status: DC | PRN
  Administered 2016-11-19: 5 mL

## 2016-11-19 MED ORDER — ONDANSETRON HCL 4 MG/2ML IJ SOLN
INTRAMUSCULAR | Status: DC | PRN
  Administered 2016-11-19: 4 mg via INTRAVENOUS

## 2016-11-19 MED ORDER — PROPOFOL 10 MG/ML IV BOLUS
INTRAVENOUS | Status: DC | PRN
  Administered 2016-11-19: 130 mg via INTRAVENOUS

## 2016-11-19 MED ORDER — ROCURONIUM BROMIDE 100 MG/10ML IV SOLN
INTRAVENOUS | Status: DC | PRN
  Administered 2016-11-19: 40 mg via INTRAVENOUS
  Administered 2016-11-19: 10 mg via INTRAVENOUS

## 2016-11-19 MED ORDER — LACTATED RINGERS IV SOLN
INTRAVENOUS | Status: DC
  Administered 2016-11-19 (×2): via INTRAVENOUS

## 2016-11-19 MED ORDER — LIDOCAINE HCL 2 % EX GEL
CUTANEOUS | Status: DC | PRN
  Administered 2016-11-19: 1 via TOPICAL

## 2016-11-19 MED ORDER — FENTANYL CITRATE (PF) 100 MCG/2ML IJ SOLN
25.0000 ug | INTRAMUSCULAR | Status: DC | PRN
  Administered 2016-11-19 (×4): 25 ug via INTRAVENOUS

## 2016-11-19 MED ORDER — FENTANYL CITRATE (PF) 100 MCG/2ML IJ SOLN
INTRAMUSCULAR | Status: DC | PRN
  Administered 2016-11-19 (×3): 50 ug via INTRAVENOUS

## 2016-11-19 MED ORDER — SUGAMMADEX SODIUM 200 MG/2ML IV SOLN
INTRAVENOUS | Status: DC | PRN
  Administered 2016-11-19: 200 mg via INTRAVENOUS

## 2016-11-19 MED ORDER — BUPIVACAINE-EPINEPHRINE (PF) 0.25% -1:200000 IJ SOLN
INTRAMUSCULAR | Status: AC
  Filled 2016-11-19: qty 30

## 2016-11-19 MED ORDER — GLYCOPYRROLATE 0.2 MG/ML IJ SOLN
INTRAMUSCULAR | Status: DC | PRN
  Administered 2016-11-19: 0.2 mg via INTRAVENOUS

## 2016-11-19 MED ORDER — MIDAZOLAM HCL 2 MG/2ML IJ SOLN
INTRAMUSCULAR | Status: DC | PRN
Start: 2016-11-19 — End: 2016-11-19
  Administered 2016-11-19: 2 mg via INTRAVENOUS

## 2016-11-19 MED ORDER — BUPIVACAINE-EPINEPHRINE (PF) 0.25% -1:200000 IJ SOLN
INTRAMUSCULAR | Status: DC | PRN
  Administered 2016-11-19: 30 mL via PERINEURAL

## 2016-11-19 MED ORDER — DEXAMETHASONE SODIUM PHOSPHATE 10 MG/ML IJ SOLN
INTRAMUSCULAR | Status: DC | PRN
  Administered 2016-11-19: 10 mg via INTRAVENOUS

## 2016-11-19 MED ORDER — FENTANYL CITRATE (PF) 100 MCG/2ML IJ SOLN
INTRAMUSCULAR | Status: AC
  Administered 2016-11-19: 15:00:00
  Filled 2016-11-19: qty 2

## 2016-11-19 MED ORDER — LIDOCAINE HCL (CARDIAC) 20 MG/ML IV SOLN
INTRAVENOUS | Status: DC | PRN
  Administered 2016-11-19: 80 mg via INTRAVENOUS

## 2016-11-19 SURGICAL SUPPLY — 49 items
ADHESIVE MASTISOL STRL (MISCELLANEOUS) ×2 IMPLANT
APPLIER CLIP ROT 10 11.4 M/L (STAPLE) ×2
BLADE SURG SZ11 CARB STEEL (BLADE) ×2 IMPLANT
CANISTER SUCT 1200ML W/VALVE (MISCELLANEOUS) ×2 IMPLANT
CATH CHOLANGI 4FR 420404F (CATHETERS) ×2 IMPLANT
CHLORAPREP W/TINT 26ML (MISCELLANEOUS) ×2 IMPLANT
CLIP APPLIE ROT 10 11.4 M/L (STAPLE) ×1 IMPLANT
CONRAY 60ML FOR OR (MISCELLANEOUS) ×2 IMPLANT
DRAPE C-ARM XRAY 36X54 (DRAPES) ×2 IMPLANT
ELECT REM PT RETURN 9FT ADLT (ELECTROSURGICAL) ×2
ELECTRODE REM PT RTRN 9FT ADLT (ELECTROSURGICAL) ×1 IMPLANT
ENDOPOUCH RETRIEVER 10 (MISCELLANEOUS) ×2 IMPLANT
GAUZE SPONGE 4X4 12PLY STRL (GAUZE/BANDAGES/DRESSINGS) ×2 IMPLANT
GAUZE SPONGE NON-WVN 2X2 STRL (MISCELLANEOUS) ×4 IMPLANT
GLOVE BIO SURGEON STRL SZ8 (GLOVE) ×2 IMPLANT
GOWN STRL REUS W/ TWL LRG LVL3 (GOWN DISPOSABLE) ×4 IMPLANT
GOWN STRL REUS W/TWL LRG LVL3 (GOWN DISPOSABLE) ×4
HEMOSTAT SURGICEL 2X14 (HEMOSTASIS) ×2 IMPLANT
IRRIGATION STRYKERFLOW (MISCELLANEOUS) ×1 IMPLANT
IRRIGATOR STRYKERFLOW (MISCELLANEOUS) ×2
IV CATH ANGIO 12GX3 LT BLUE (NEEDLE) ×2 IMPLANT
IV NS 1000ML (IV SOLUTION) ×1
IV NS 1000ML BAXH (IV SOLUTION) ×1 IMPLANT
JACKSON PRATT 10 (INSTRUMENTS) ×2 IMPLANT
KIT RM TURNOVER STRD PROC AR (KITS) ×2 IMPLANT
LABEL OR SOLS (LABEL) ×2 IMPLANT
NDL SAFETY 22GX1.5 (NEEDLE) ×2 IMPLANT
NEEDLE VERESS 14GA 120MM (NEEDLE) ×2 IMPLANT
NS IRRIG 500ML POUR BTL (IV SOLUTION) ×2 IMPLANT
PACK LAP CHOLECYSTECTOMY (MISCELLANEOUS) ×2 IMPLANT
PAD ABD DERMACEA PRESS 5X9 (GAUZE/BANDAGES/DRESSINGS) ×2 IMPLANT
PENCIL ELECTRO HAND CTR (MISCELLANEOUS) ×2 IMPLANT
SCISSORS METZENBAUM CVD 33 (INSTRUMENTS) ×2 IMPLANT
SLEEVE ENDOPATH XCEL 5M (ENDOMECHANICALS) ×4 IMPLANT
SPONGE LAP 18X18 5 PK (GAUZE/BANDAGES/DRESSINGS) ×2 IMPLANT
SPONGE VERSALON 2X2 STRL (MISCELLANEOUS) ×4
SPONGE VERSALON 4X4 4PLY (MISCELLANEOUS) ×2 IMPLANT
STAPLER SKIN PROX 35W (STAPLE) ×2 IMPLANT
STRIP CLOSURE SKIN 1/2X4 (GAUZE/BANDAGES/DRESSINGS) ×2 IMPLANT
STRIP CLOSURE SKIN 1/4X4 (GAUZE/BANDAGES/DRESSINGS) ×2 IMPLANT
SUT MNCRL 4-0 (SUTURE) ×1
SUT MNCRL 4-0 27XMFL (SUTURE) ×1
SUT VIC AB 0 CT1 36 (SUTURE) ×6 IMPLANT
SUT VICRYL 0 AB UR-6 (SUTURE) ×2 IMPLANT
SUTURE MNCRL 4-0 27XMF (SUTURE) ×1 IMPLANT
SYR 20CC LL (SYRINGE) ×2 IMPLANT
TROCAR XCEL NON-BLD 11X100MML (ENDOMECHANICALS) ×2 IMPLANT
TROCAR XCEL NON-BLD 5MMX100MML (ENDOMECHANICALS) ×2 IMPLANT
TUBING INSUFFLATOR HI FLOW (MISCELLANEOUS) ×2 IMPLANT

## 2016-11-19 NOTE — Anesthesia Procedure Notes (Signed)
Procedure Name: Intubation Date/Time: 11/19/2016 12:55 PM Performed by: Doreen Salvage Pre-anesthesia Checklist: Patient identified, Patient being monitored, Timeout performed, Emergency Drugs available and Suction available Patient Re-evaluated:Patient Re-evaluated prior to inductionOxygen Delivery Method: Circle system utilized Preoxygenation: Pre-oxygenation with 100% oxygen Intubation Type: IV induction Ventilation: Mask ventilation without difficulty Laryngoscope Size: Mac and 3 Grade View: Grade I Tube type: Oral Tube size: 7.0 mm Number of attempts: 1 Airway Equipment and Method: Stylet Placement Confirmation: ETT inserted through vocal cords under direct vision,  positive ETCO2 and breath sounds checked- equal and bilateral Secured at: 21 cm Tube secured with: Tape Dental Injury: Teeth and Oropharynx as per pre-operative assessment

## 2016-11-19 NOTE — Progress Notes (Signed)
CC: Right upper quadrant pain Subjective: This patient with acute cholecystitis her pain is no better today her elevated liver function tests are persistent she's had no nausea or vomiting today. Denies fevers or chills but her pain is no better.  Objective: Vital signs in last 24 hours: Temp:  [98.6 F (37 C)-99.1 F (37.3 C)] 99.1 F (37.3 C) (11/22 0506) Pulse Rate:  [80-94] 94 (11/22 0506) Resp:  [13-20] 18 (11/22 0506) BP: (109-128)/(55-66) 122/60 (11/22 0506) SpO2:  [95 %-98 %] 96 % (11/22 0506) Last BM Date: 11/17/16  Intake/Output from previous day: 11/21 0701 - 11/22 0700 In: 1781 [I.V.:1581; IV Piggyback:200] Out: 350 [Urine:350] Intake/Output this shift: No intake/output data recorded.  Physical exam:  No icterus no jaundice Vital signs are reviewed and stable low grade temp Abdomen is soft but tender in the right upper quadrant with a positive Murphy sign extremities without edema calves are nontender   Lab Results: CBC   Recent Labs  11/18/16 0916 11/19/16 0435  WBC 32.8* 32.8*  HGB 15.7 13.8  HCT 44.0 39.0  PLT 193 173   BMET  Recent Labs  11/18/16 0916 11/19/16 0435  NA 131* 135  K 3.5 3.1*  CL 98* 105  CO2 23 24  GLUCOSE 131* 132*  BUN 17 19  CREATININE 0.68 0.56  CALCIUM 9.3 8.1*   PT/INR No results for input(s): LABPROT, INR in the last 72 hours. ABG No results for input(s): PHART, HCO3 in the last 72 hours.  Invalid input(s): PCO2, PO2  Studies/Results: US Abdomen Limited Ruq  Result Date: 11/18/2016 CLINICAL DATA:  Abdominal pain EXAM: US ABDOMEN LIMITED - RIGHT UPPER QUADRANT COMPARISON:  CT abdomen and pelvis April 18, 2008 FINDINGS: Gallbladder: Within the gallbladder, there are multiple echogenic foci which move and shadow consistent with gallstones. Largest gallstone measures 1.9 cm in length. A 9 mm polyp is also noted along the wall of gallbladder anteriorly. There is gallbladder wall thickening and edema with mild  pericholecystic fluid. No sonographic Murphy sign noted by sonographer. The patient was given morphine which could mask a positive Murphy sign. Common bile duct: Diameter: 6 mm. No extrahepatic biliary duct dilatation there appears to be mild intrahepatic biliary duct dilatation. Liver: There is a cyst in the medial segment left lobe of the liver measuring 2 x 2 x 2.7 cm. There is an adjacent cyst measuring 1.0 x 0.7 x 0.7 cm in this area. A third nearby cyst measures 1.5 x 1.8 x 1.9 cm. In the anterior segment right lobe of the liver, there is a cyst containing minimal septations measured 3.3 x 3.2 x 3.2 cm. Within normal limits in parenchymal echogenicity. IMPRESSION: Cholelithiasis with probable 9 mm polyp along the anterior wall as well. There is gallbladder wall thickening and edema or pericholecystic fluid. These are changes indicative of acute cholecystitis. There is mild intrahepatic biliary duct dilatation. Common bile duct is not dilated. There are several cysts in the liver. Electronically Signed   By: Lowella Grip III M.D.   On: 11/18/2016 11:13    Anti-infectives: Anti-infectives    Start     Dose/Rate Route Frequency Ordered Stop   11/18/16 1500  Ampicillin-Sulbactam (UNASYN) 3 g in sodium chloride 0.9 % 100 mL IVPB     3 g 200 mL/hr over 30 Minutes Intravenous Every 6 hours 11/18/16 1324     11/18/16 1130  piperacillin-tazobactam (ZOSYN) IVPB 3.375 g     3.375 g 100 mL/hr over 30 Minutes Intravenous  Once 11/18/16 1116 11/18/16 1153      Assessment/Plan:  LFTs are slightly more elevated today I suspect that this is not related to choledocholithiasis but she is on antibiotics at this point. We'll plan laparoscopic cholecystectomy with cholangiography later today. The rationale for this was discussed with the patient and her husband the options of observation reviewed and the risks of bleeding infection recurrence of symptoms conversion to an open procedure retained common bile  duct stone requiring ERCP or common duct exploration were reviewed with them as well as risk of bowel or bile duct injury. He understood and agreed to proceed  Florene Glen, MD, FACS  11/19/2016

## 2016-11-19 NOTE — Op Note (Signed)
Laparoscopic Cholecystectomy with cholangiography  Pre-operative Diagnosis: Acute cholecystitis  Post-operative Diagnosis: Acute gangrenous cholecystitis  Procedure: Laparoscopic cholecystectomy with cholangiography  Surgeon: Jerrol Banana. Burt Knack, MD FACS  Anesthesia: Gen. with endotracheal tube  Assistant: Surgical tech  Procedure Details  The patient was seen again in the Holding Room. The benefits, complications, treatment options, and expected outcomes were discussed with the patient. The risks of bleeding, infection, recurrence of symptoms, failure to resolve symptoms, bile duct damage, bile duct leak, retained common bile duct stone, bowel injury, any of which could require further surgery and/or ERCP, stent, or papillotomy were reviewed with the patient. The likelihood of improving the patient's symptoms with return to their baseline status is good.  The patient and/or family concurred with the proposed plan, giving informed consent.  The patient was taken to Operating Room, identified as Lori Thornton and the procedure verified as Laparoscopic Cholecystectomy.  A Time Out was held and the above information confirmed.  Prior to the induction of general anesthesia, antibiotic prophylaxis was administered. VTE prophylaxis was in place. General endotracheal anesthesia was then administered and tolerated well. After the induction, the abdomen was prepped with Chloraprep and draped in the sterile fashion. The patient was positioned in the supine position.  Local anesthetic  was injected into the skin near the umbilicus and an incision made. The Veress needle was placed. Pneumoperitoneum was then created with CO2 and tolerated well without any adverse changes in the patient's vital signs. A 48mm port was placed in the periumbilical position and the abdominal cavity was explored.  Two 5-mm ports were placed in the right upper quadrant and a 12 mm epigastric port was placed all under direct vision.  All skin incisions  were infiltrated with a local anesthetic agent before making the incision and placing the trocars.   The patient was positioned  in reverse Trendelenburg, tilted slightly to the patient's left.  The gallbladder was identified, the fundus grasped and retracted cephalad. It was massively dilated and required aspiration for grasping. This was performed with a needle aspirate. Adhesions were lysed bluntly. The infundibulum was grasped and retracted laterally, exposing the peritoneum overlying the triangle of Calot. This was then divided and exposed in a blunt fashion. A critical view of the cystic duct and cystic artery was obtained.  The cystic duct was clearly identified and bluntly dissected. The cystic artery and multiple small branches were doubly clipped and divided.  The cystic duct was well identified as it entered the infundibulum of the gallbladder here was clipped and incised and through a separate incision and Angiocath a cholangiocatheter was placed and C-arm fluoroscopic angiography was performed. There was good flow into the duodenum without intraluminal filling defects the cystic duct had been cannulated the proximal ducts were well identified.  The cystic duct catheter was removed and the cystic duct was doubly clipped and divided  The gallbladder was taken from the gallbladder fossa in a retrograde fashion with the electrocautery. The posterior surface of the gallbladder appeared to be gangrenous. Multiple small vessels on either side of the gallbladder which was acutely inflamed required lipping and division. The gallbladder was removed and placed in an Endocatch bag. The upper incision required considerable enlargement or order to get the quite enlarged and gallstone filled gallbladder out of the abdominal cavity. The gallbladder was opened on the back table and measured at approximately 18 cm x 8 cm and filled with stones of all sizes.   The liver bed was irrigated and  inspected. Hemostasis was achieved with the electrocautery. Copious irrigation was utilized and was repeatedly aspirated until clear.   A 10 mm drain was placed into the foramen Winslow and brought out through a lateral port site and held in with 3-0 nylon. A piece of Surgicel was placed on the rather raw gallbladder fossa.  Inspection of the right upper quadrant was performed. No bleeding, bile duct injury or leak, or bowel injury was noted. Pneumoperitoneum was released.  The epigastric port site was closed with figure-of-eight 0 Vicryl sutures. Skin staples were placed in all wounds. Drain was placed to drain canister suction  sterile dressings were  applied.  The patient was then extubated and brought to the recovery room in stable condition. Sponge, lap, and needle counts were correct at closure and at the conclusion of the case.   Findings: Acute gangrenous Cholecystitis   Estimated Blood Loss: 250 cc         Drains: JP 1         Specimens: Gallbladder           Complications: none               Lori Kimoto E. Burt Knack, MD, FACS

## 2016-11-19 NOTE — Progress Notes (Signed)
Preoperative Review   Patient is met in the preoperative holding area. The history is reviewed in the chart and with the patient. I personally reviewed the options and rationale as well as the risks of this procedure that have been previously discussed with the patient. All questions asked by the patient and/or family were answered to their satisfaction.  Patient agrees to proceed with this procedure at this time.  Margaretha Mahan E Kelty Szafran M.D. FACS  

## 2016-11-19 NOTE — Anesthesia Preprocedure Evaluation (Addendum)
Anesthesia Evaluation  Patient identified by MRN, date of birth, ID band Patient awake    Reviewed: Allergy & Precautions, NPO status , Patient's Chart, lab work & pertinent test results, reviewed documented beta blocker date and time   Airway Mallampati: II  TM Distance: >3 FB     Dental  (+) Chipped   Pulmonary           Cardiovascular      Neuro/Psych    GI/Hepatic   Endo/Other    Renal/GU      Musculoskeletal   Abdominal   Peds  Hematology   Anesthesia Other Findings Ov ca. EKG ok.  Reproductive/Obstetrics                            Anesthesia Physical Anesthesia Plan  ASA: III  Anesthesia Plan: General   Post-op Pain Management:    Induction: Intravenous  Airway Management Planned: Oral ETT  Additional Equipment:   Intra-op Plan:   Post-operative Plan:   Informed Consent: I have reviewed the patients History and Physical, chart, labs and discussed the procedure including the risks, benefits and alternatives for the proposed anesthesia with the patient or authorized representative who has indicated his/her understanding and acceptance.     Plan Discussed with: CRNA  Anesthesia Plan Comments:         Anesthesia Quick Evaluation

## 2016-11-19 NOTE — Transfer of Care (Signed)
Immediate Anesthesia Transfer of Care Note  Patient: Lori Thornton  Procedure(s) Performed: Procedure(s): LAPAROSCOPIC CHOLECYSTECTOMY WITH INTRAOPERATIVE CHOLANGIOGRAM (N/A)  Patient Location: PACU  Anesthesia Type:General  Level of Consciousness: sedated  Airway & Oxygen Therapy: Patient Spontanous Breathing and Patient connected to face mask oxygen  Post-op Assessment: Report given to RN and Post -op Vital signs reviewed and stable  Post vital signs: Reviewed and stable  Last Vitals:  Vitals:   11/19/16 1117 11/19/16 1416  BP: 132/71 (!) 156/79  Pulse: 97 (!) 102  Resp: 18 18  Temp: 36.2 C 0000000 C    Complications: No apparent anesthesia complications

## 2016-11-20 LAB — CBC WITH DIFFERENTIAL/PLATELET
Basophils Absolute: 0 10*3/uL (ref 0–0.1)
Basophils Relative: 0 %
EOS ABS: 0 10*3/uL (ref 0–0.7)
Eosinophils Relative: 0 %
HEMATOCRIT: 33.2 % — AB (ref 35.0–47.0)
HEMOGLOBIN: 12 g/dL (ref 12.0–16.0)
LYMPHS ABS: 0.5 10*3/uL — AB (ref 1.0–3.6)
Lymphocytes Relative: 2 %
MCH: 32.4 pg (ref 26.0–34.0)
MCHC: 36.3 g/dL — ABNORMAL HIGH (ref 32.0–36.0)
MCV: 89.2 fL (ref 80.0–100.0)
Monocytes Absolute: 1.2 10*3/uL — ABNORMAL HIGH (ref 0.2–0.9)
Monocytes Relative: 5 %
NEUTROS ABS: 26.1 10*3/uL — AB (ref 1.4–6.5)
NEUTROS PCT: 93 %
Platelets: 189 10*3/uL (ref 150–440)
RBC: 3.72 MIL/uL — AB (ref 3.80–5.20)
RDW: 13.4 % (ref 11.5–14.5)
WBC: 27.9 10*3/uL — AB (ref 3.6–11.0)

## 2016-11-20 LAB — COMPREHENSIVE METABOLIC PANEL
ALBUMIN: 2.4 g/dL — AB (ref 3.5–5.0)
ALK PHOS: 124 U/L (ref 38–126)
ALT: 129 U/L — AB (ref 14–54)
AST: 53 U/L — ABNORMAL HIGH (ref 15–41)
Anion gap: 5 (ref 5–15)
BUN: 12 mg/dL (ref 6–20)
CALCIUM: 7.6 mg/dL — AB (ref 8.9–10.3)
CO2: 25 mmol/L (ref 22–32)
CREATININE: 0.52 mg/dL (ref 0.44–1.00)
Chloride: 105 mmol/L (ref 101–111)
GFR calc non Af Amer: >60 mL/min (ref >60)
GLUCOSE: 123 mg/dL — AB (ref 65–99)
Potassium: 2.9 mmol/L — ABNORMAL LOW (ref 3.5–5.1)
SODIUM: 135 mmol/L (ref 135–145)
Total Bilirubin: 1.2 mg/dL (ref 0.3–1.2)
Total Protein: 5.3 g/dL — ABNORMAL LOW (ref 6.5–8.1)

## 2016-11-20 MED ORDER — ALUM & MAG HYDROXIDE-SIMETH 200-200-20 MG/5ML PO SUSP
30.0000 mL | Freq: Four times a day (QID) | ORAL | Status: DC | PRN
  Filled 2016-11-20: qty 30

## 2016-11-20 NOTE — Plan of Care (Signed)
Problem: Activity: Goal: Risk for activity intolerance will decrease Outcome: Progressing Pt was able to ambulate with assistance today. Tolerated well.

## 2016-11-20 NOTE — Progress Notes (Signed)
1 Day Post-Op  Subjective: Status post laparoscopic cholecystectomy for acute gangrenous cholecystitis. Patient feels well today feels much better than she did yesterday with no nausea vomiting and no fevers or chills. Her pain is well controlled  Objective: Vital signs in last 24 hours: Temp:  [97.2 F (36.2 C)-98.9 F (37.2 C)] 98.5 F (36.9 C) (11/23 0601) Pulse Rate:  [78-102] 78 (11/23 0601) Resp:  [10-22] 22 (11/23 0601) BP: (132-156)/(65-84) 147/65 (11/23 0601) SpO2:  [92 %-100 %] 96 % (11/23 0601) Weight:  [125 lb (56.7 kg)] 125 lb (56.7 kg) (11/22 1117) Last BM Date: 11/17/16  Intake/Output from previous day: 11/22 0701 - 11/23 0700 In: [id number] [P.O.:240; I.V.:3343; IV Piggyback:100] Out: 72 [Urine:500; Drains:30; Blood:300] Intake/Output this shift: Total I/O In: -  Out: 500 [Urine:500]  Physical exam:  No icterus no jaundice abdomen is soft and minimally tender minimally distended nontympanitic. No bile in drain calves are nontender  Lab Results: CBC   Recent Labs  11/19/16 0435 11/20/16 0511  WBC 32.8* 27.9*  HGB 13.8 12.0  HCT 39.0 33.2*  PLT 173 189   BMET  Recent Labs  11/19/16 0435 11/20/16 0511  NA 135 135  K 3.1* 2.9*  CL 105 105  CO2 24 25  GLUCOSE 132* 123*  BUN 19 12  CREATININE 0.56 0.52  CALCIUM 8.1* 7.6*   PT/INR No results for input(s): LABPROT, INR in the last 72 hours. ABG No results for input(s): PHART, HCO3 in the last 72 hours.  Invalid input(s): PCO2, PO2  Studies/Results: Dg Cholangiogram Operative  Result Date: 11/19/2016 CLINICAL DATA:  Cholecystitis EXAM: INTRAOPERATIVE CHOLANGIOGRAM TECHNIQUE: Cholangiographic images from the C-arm fluoroscopic device were submitted for interpretation post-operatively. Please see the procedural report for the amount of contrast and the fluoroscopy time utilized. COMPARISON:  None. FINDINGS: Contrast fills the biliary tree and duodenum without filling defects in the common bile duct.  IMPRESSION: Patent biliary tree. Electronically Signed   By: Marybelle Killings M.D.   On: 11/19/2016 13:48   US Abdomen Limited Ruq  Result Date: 11/18/2016 CLINICAL DATA:  Abdominal pain EXAM: US ABDOMEN LIMITED - RIGHT UPPER QUADRANT COMPARISON:  CT abdomen and pelvis April 18, 2008 FINDINGS: Gallbladder: Within the gallbladder, there are multiple echogenic foci which move and shadow consistent with gallstones. Largest gallstone measures 1.9 cm in length. A 9 mm polyp is also noted along the wall of gallbladder anteriorly. There is gallbladder wall thickening and edema with mild pericholecystic fluid. No sonographic Murphy sign noted by sonographer. The patient was given morphine which could mask a positive Murphy sign. Common bile duct: Diameter: 6 mm. No extrahepatic biliary duct dilatation there appears to be mild intrahepatic biliary duct dilatation. Liver: There is a cyst in the medial segment left lobe of the liver measuring 2 x 2 x 2.7 cm. There is an adjacent cyst measuring 1.0 x 0.7 x 0.7 cm in this area. A third nearby cyst measures 1.5 x 1.8 x 1.9 cm. In the anterior segment right lobe of the liver, there is a cyst containing minimal septations measured 3.3 x 3.2 x 3.2 cm. Within normal limits in parenchymal echogenicity. IMPRESSION: Cholelithiasis with probable 9 mm polyp along the anterior wall as well. There is gallbladder wall thickening and edema or pericholecystic fluid. These are changes indicative of acute cholecystitis. There is mild intrahepatic biliary duct dilatation. Common bile duct is not dilated. There are several cysts in the liver. Electronically Signed   By: Lowella Grip III M.D.  On: 11/18/2016 11:13    Anti-infectives: Anti-infectives    Start     Dose/Rate Route Frequency Ordered Stop   11/18/16 1500  Ampicillin-Sulbactam (UNASYN) 3 g in sodium chloride 0.9 % 100 mL IVPB     3 g 200 mL/hr over 30 Minutes Intravenous Every 6 hours 11/18/16 1324     11/18/16 1130   piperacillin-tazobactam (ZOSYN) IVPB 3.375 g     3.375 g 100 mL/hr over 30 Minutes Intravenous  Once 11/18/16 1116 11/18/16 1153      Assessment/Plan: s/p Procedure(s): LAPAROSCOPIC CHOLECYSTECTOMY WITH INTRAOPERATIVE CHOLANGIOGRAM   LFTs are improved white blood cell count remains markedly increased but improved as well. Patient feels well today. Will start full liquid diet and continue IV antibiotics.  Florene Glen, MD, FACS  11/20/2016

## 2016-11-21 ENCOUNTER — Encounter: Payer: Self-pay | Admitting: Surgery

## 2016-11-21 ENCOUNTER — Inpatient Hospital Stay: Payer: BLUE CROSS/BLUE SHIELD

## 2016-11-21 LAB — CBC WITH DIFFERENTIAL/PLATELET
BASOS PCT: 0 %
Basophils Absolute: 0 10*3/uL (ref 0–0.1)
EOS ABS: 0 10*3/uL (ref 0–0.7)
EOS PCT: 0 %
HCT: 36.4 % (ref 35.0–47.0)
Hemoglobin: 13.1 g/dL (ref 12.0–16.0)
LYMPHS ABS: 0.8 10*3/uL — AB (ref 1.0–3.6)
Lymphocytes Relative: 4 %
MCH: 32 pg (ref 26.0–34.0)
MCHC: 35.9 g/dL (ref 32.0–36.0)
MCV: 89.1 fL (ref 80.0–100.0)
Monocytes Absolute: 1.5 10*3/uL — ABNORMAL HIGH (ref 0.2–0.9)
Monocytes Relative: 8 %
Neutro Abs: 17.2 10*3/uL — ABNORMAL HIGH (ref 1.4–6.5)
Neutrophils Relative %: 88 %
PLATELETS: 249 10*3/uL (ref 150–440)
RBC: 4.09 MIL/uL (ref 3.80–5.20)
RDW: 13.1 % (ref 11.5–14.5)
WBC: 19.5 10*3/uL — AB (ref 3.6–11.0)

## 2016-11-21 LAB — COMPREHENSIVE METABOLIC PANEL
ALT: 76 U/L — ABNORMAL HIGH (ref 14–54)
ANION GAP: 9 (ref 5–15)
AST: 20 U/L (ref 15–41)
Albumin: 2.2 g/dL — ABNORMAL LOW (ref 3.5–5.0)
Alkaline Phosphatase: 98 U/L (ref 38–126)
BUN: 15 mg/dL (ref 6–20)
CHLORIDE: 101 mmol/L (ref 101–111)
CO2: 27 mmol/L (ref 22–32)
Calcium: 8.2 mg/dL — ABNORMAL LOW (ref 8.9–10.3)
Creatinine, Ser: 0.55 mg/dL (ref 0.44–1.00)
GFR calc non Af Amer: >60 mL/min (ref >60)
Glucose, Bld: 112 mg/dL — ABNORMAL HIGH (ref 65–99)
Potassium: 2.7 mmol/L — CL (ref 3.5–5.1)
SODIUM: 137 mmol/L (ref 135–145)
Total Bilirubin: 1.1 mg/dL (ref 0.3–1.2)
Total Protein: 5.3 g/dL — ABNORMAL LOW (ref 6.5–8.1)

## 2016-11-21 MED ORDER — TECHNETIUM TC 99M MEBROFENIN IV KIT
5.0000 | PACK | Freq: Once | INTRAVENOUS | Status: AC | PRN
  Administered 2016-11-21: 5.367 via INTRAVENOUS

## 2016-11-21 MED ORDER — POTASSIUM CHLORIDE CRYS ER 20 MEQ PO TBCR: 80.0000 meq | EXTENDED_RELEASE_TABLET | Freq: Once | ORAL | Status: DC

## 2016-11-21 MED ORDER — BISACODYL 10 MG RE SUPP
10.0000 mg | Freq: Once | RECTAL | Status: AC
  Administered 2016-11-21: 10 mg via RECTAL
  Filled 2016-11-21: qty 1

## 2016-11-21 MED ORDER — POTASSIUM CHLORIDE IN NACL 40-0.9 MEQ/L-% IV SOLN
INTRAVENOUS | Status: DC
  Administered 2016-11-21 – 2016-11-23 (×4): 75 mL/h via INTRAVENOUS
  Filled 2016-11-21 (×6): qty 1000

## 2016-11-21 MED ORDER — POTASSIUM CHLORIDE 20 MEQ/15ML (10%) PO SOLN
40.0000 meq | ORAL | Status: AC
  Administered 2016-11-21: 40 meq via ORAL
  Filled 2016-11-21 (×2): qty 30

## 2016-11-21 NOTE — Progress Notes (Signed)
Hepatobiliary scan personally reviewed. No sign of leakage and intact ducts are noted. This is suggestive that the biliary tree is intact and not damaged.  Patient's white blood cell count is improving and LFTs are normal and she is not acidotic and in fact discussing with the nurse just now patient states that she passed gas and feels better. On full liquid diet for now. I will give [place] a Dulcolax suppository as well. The etiology of the small amount of bile tinge on the dressing this is unclear to me unless there was spillage of bile from the enormously enlarged gallbladder that could be leaking out around the wound. Again there is no sign of bile leak on hepatobiliary scan and no bile in the drain which is placed in the gravity dependent portion of the foramen of Winslow. We will reevaluate. If this persists we'll get a CT scan.

## 2016-11-21 NOTE — Progress Notes (Signed)
Primary Nurse notified Dr. Azalee Course of critical potassium of 2.7. Orders received for NS with 40 K @ 75 along with 80 meq of potassium when able to tolerate PO. Primary Nurse to continue to monitor.

## 2016-11-21 NOTE — Care Management Important Message (Signed)
Important Message  Patient Details  Name: CORETHA MELERINE MRN: [id number] Date of Birth: [birth year]   Medicare Important Message Given:  Yes    Adja Ruff A, RN 11/21/2016, 7:52 AM

## 2016-11-21 NOTE — Progress Notes (Signed)
2 Days Post-Op  Subjective: Status post laparoscopic cholecystectomies for severe acute cholecystitis. She has had some nausea and some uncomfortable feelings and not ready to progress. Denies fevers or chills  Objective: Vital signs in last 24 hours: Temp:  [98.2 F (36.8 C)-98.5 F (36.9 C)] 98.2 F (36.8 C) (11/24 0501) Pulse Rate:  [81-91] 81 (11/24 0501) Resp:  [16-20] 20 (11/24 0501) BP: (151-155)/(70-81) 151/81 (11/24 0501) SpO2:  [96 %-98 %] 98 % (11/24 0501) Last BM Date: 11/17/16  Intake/Output from previous day: 11/23 0701 - 11/24 0700 In: 966 [P.O.:900; I.V.:66] Out: 1710 [Urine:1350; Emesis/NG output:200; Drains:160] Intake/Output this shift: Total I/O In: -  Out: 350 [Urine:250; Drains:100]  Physical exam:  Vital signs are reviewed Abdomen is soft and minimally distended nontender the drain has no bile in it but the dressing around the drain is slightly bile tinged. Calves are nontender  Lab Results: CBC   Recent Labs  11/20/16 0511 11/21/16 0511  WBC 27.9* 19.5*  HGB 12.0 13.1  HCT 33.2* 36.4  PLT 189 249   BMET  Recent Labs  11/20/16 0511 11/21/16 0511  NA 135 137  K 2.9* 2.7*  CL 105 101  CO2 25 27  GLUCOSE 123* 112*  BUN 12 15  CREATININE 0.52 0.55  CALCIUM 7.6* 8.2*   PT/INR No results for input(s): LABPROT, INR in the last 72 hours. ABG No results for input(s): PHART, HCO3 in the last 72 hours.  Invalid input(s): PCO2, PO2  Studies/Results: Dg Cholangiogram Operative  Result Date: 11/19/2016 CLINICAL DATA:  Cholecystitis EXAM: INTRAOPERATIVE CHOLANGIOGRAM TECHNIQUE: Cholangiographic images from the C-arm fluoroscopic device were submitted for interpretation post-operatively. Please see the procedural report for the amount of contrast and the fluoroscopy time utilized. COMPARISON:  None. FINDINGS: Contrast fills the biliary tree and duodenum without filling defects in the common bile duct. IMPRESSION: Patent biliary tree.  Electronically Signed   By: Marybelle Killings M.D.   On: 11/19/2016 13:48    Anti-infectives: Anti-infectives    Start     Dose/Rate Route Frequency Ordered Stop   11/18/16 1500  Ampicillin-Sulbactam (UNASYN) 3 g in sodium chloride 0.9 % 100 mL IVPB     3 g 200 mL/hr over 30 Minutes Intravenous Every 6 hours 11/18/16 1324     11/18/16 1130  piperacillin-tazobactam (ZOSYN) IVPB 3.375 g     3.375 g 100 mL/hr over 30 Minutes Intravenous  Once 11/18/16 1116 11/18/16 1153      Assessment/Plan: s/p Procedure(s): LAPAROSCOPIC CHOLECYSTECTOMY WITH INTRAOPERATIVE CHOLANGIOGRAM   LFTs are reviewed. LFTs and white blood cell count are all improving. But I'm concerned about the bile tinge on her dressings in spite of having no bile in the drain. With that I will order a HIDA scan today to assess the bile ducts. This discussed with nursing.  Florene Glen, MD, FACS  11/21/2016

## 2016-11-21 NOTE — Progress Notes (Signed)
Per Dr. Burt Knack do not give potassium dose from 1430 that was not administered by previous RN.

## 2016-11-22 LAB — COMPREHENSIVE METABOLIC PANEL
ALBUMIN: 2.1 g/dL — AB (ref 3.5–5.0)
ALT: 47 U/L (ref 14–54)
AST: 20 U/L (ref 15–41)
Alkaline Phosphatase: 95 U/L (ref 38–126)
Anion gap: 6 (ref 5–15)
BILIRUBIN TOTAL: 1.1 mg/dL (ref 0.3–1.2)
BUN: 16 mg/dL (ref 6–20)
CO2: 24 mmol/L (ref 22–32)
Calcium: 7.8 mg/dL — ABNORMAL LOW (ref 8.9–10.3)
Chloride: 106 mmol/L (ref 101–111)
Creatinine, Ser: 0.4 mg/dL — ABNORMAL LOW (ref 0.44–1.00)
GFR calc Af Amer: >60 mL/min (ref >60)
GFR calc non Af Amer: >60 mL/min (ref >60)
GLUCOSE: 99 mg/dL (ref 65–99)
POTASSIUM: 3.3 mmol/L — AB (ref 3.5–5.1)
Sodium: 136 mmol/L (ref 135–145)
TOTAL PROTEIN: 5 g/dL — AB (ref 6.5–8.1)

## 2016-11-22 LAB — CBC WITH DIFFERENTIAL/PLATELET
BASOS ABS: 0 10*3/uL (ref 0–0.1)
BASOS PCT: 0 %
Eosinophils Absolute: 0.1 10*3/uL (ref 0–0.7)
Eosinophils Relative: 0 %
HEMATOCRIT: 35 % (ref 35.0–47.0)
HEMOGLOBIN: 12.6 g/dL (ref 12.0–16.0)
Lymphocytes Relative: 4 %
Lymphs Abs: 0.7 10*3/uL — ABNORMAL LOW (ref 1.0–3.6)
MCH: 31.8 pg (ref 26.0–34.0)
MCHC: 36 g/dL (ref 32.0–36.0)
MCV: 88.3 fL (ref 80.0–100.0)
MONO ABS: 1.8 10*3/uL — AB (ref 0.2–0.9)
Monocytes Relative: 9 %
NEUTROS ABS: 18.1 10*3/uL — AB (ref 1.4–6.5)
NEUTROS PCT: 87 %
Platelets: 228 10*3/uL (ref 150–440)
RBC: 3.97 MIL/uL (ref 3.80–5.20)
RDW: 13.5 % (ref 11.5–14.5)
WBC: 20.6 10*3/uL — ABNORMAL HIGH (ref 3.6–11.0)

## 2016-11-22 NOTE — Progress Notes (Signed)
JP insertion site leaking copious amounts of serosanguinous fluid; changing dressing multiple times; Urostomy pouch applied to protect skin around site; will monitor effectiveness. Barbaraann Faster, RN 2:03 AM 11/22/2016

## 2016-11-22 NOTE — Progress Notes (Signed)
3 Days Post-Op  Subjective: Status post laparoscopic cholecystectomy for acute cholecystitis. Patient vomited last night and thinks that it was food related and she has no appetite. But she is passing gas and having bowel movements  Objective: Vital signs in last 24 hours: Temp:  [98.2 F (36.8 C)-99.1 F (37.3 C)] 98.6 F (37 C) (11/25 0540) Pulse Rate:  [83-88] 83 (11/25 0540) Resp:  [17-20] 20 (11/25 0540) BP: (138-152)/(70-71) 152/70 (11/25 0540) SpO2:  [94 %-97 %] 97 % (11/25 0540) Last BM Date: 11/21/16  Intake/Output from previous day: 11/24 0701 - 11/25 0700 In: 2707 [I.V.:2507; IV Piggyback:200] Out: 1970 [Urine:725; Emesis/NG output:300; Drains:785] Intake/Output this shift: No intake/output data recorded.  Physical exam:  Awake alert and oriented wounds are clean no further bile staining and no bile in drain serosanguineous only.  Lab Results: CBC   Recent Labs  11/21/16 0511 11/22/16 0629  WBC 19.5* 20.6*  HGB 13.1 12.6  HCT 36.4 35.0  PLT 249 228   BMET  Recent Labs  11/21/16 0511 11/22/16 0629  NA 137 136  K 2.7* 3.3*  CL 101 106  CO2 27 24  GLUCOSE 112* 99  BUN 15 16  CREATININE 0.55 0.40*  CALCIUM 8.2* 7.8*   PT/INR No results for input(s): LABPROT, INR in the last 72 hours. ABG No results for input(s): PHART, HCO3 in the last 72 hours.  Invalid input(s): PCO2, PO2  Studies/Results: Nm Hepatobiliary Liver Func  Result Date: 11/21/2016 CLINICAL DATA:  Recent cholecystectomy with concern for biliary leak. EXAM: NUCLEAR MEDICINE HEPATOBILIARY IMAGING TECHNIQUE: Sequential anterior images of the abdomen were obtained out to 60 minutes following intravenous administration of radiopharmaceutical. RADIOPHARMACEUTICALS:  5.36 mCi Tc-12m  Choletec IV COMPARISON:  ERCP November 19, 2016 FINDINGS: Liver uptake of radiotracer is normal. There is prompt visualization of small bowel, indicating patency of the common bile duct. No ectopic radiotracer  is evident to suggest biliary leak. Gallbladder is absent. IMPRESSION: No ectopic radiotracer to suggest biliary leak. Prompt visualization of small bowel, indicating patency of the common bile duct. Gallbladder absent. Electronically Signed   By: Lowella Grip III M.D.   On: 11/21/2016 10:23    Anti-infectives: Anti-infectives    Start     Dose/Rate Route Frequency Ordered Stop   11/18/16 1500  Ampicillin-Sulbactam (UNASYN) 3 g in sodium chloride 0.9 % 100 mL IVPB     3 g 200 mL/hr over 30 Minutes Intravenous Every 6 hours 11/18/16 1324     11/18/16 1130  piperacillin-tazobactam (ZOSYN) IVPB 3.375 g     3.375 g 100 mL/hr over 30 Minutes Intravenous  Once 11/18/16 1116 11/18/16 1153      Assessment/Plan: s/p Procedure(s): LAPAROSCOPIC CHOLECYSTECTOMY WITH INTRAOPERATIVE CHOLANGIOGRAM   Labs reviewed will advance diet to attempt to improve intake. But at this point no sign of complication and we'll continue IV antibiotics  Florene Glen, MD, FACS  11/22/2016

## 2016-11-23 MED ORDER — HYDROCODONE-ACETAMINOPHEN 5-300 MG PO TABS: 1.0000 | ORAL_TABLET | ORAL | 0 refills | Status: DC | PRN

## 2016-11-23 MED ORDER — AMOXICILLIN-POT CLAVULANATE 875-125 MG PO TABS: 1.0000 | ORAL_TABLET | Freq: Two times a day (BID) | ORAL | 1 refills | Status: AC

## 2016-11-23 NOTE — Discharge Summary (Signed)
Physician Discharge Summary  Patient ID: Lori Thornton MRN: [id number] DOB/AGE: [birth year] 68 y.o.  Admit date: 11/18/2016 Discharge date: 11/23/2016   Discharge Diagnoses:  Active Problems:   Acute cholecystitis   Procedures:Laparoscopic cholecystectomy  Hospital Course: This a patient with acute cholecystitis who presented to the emergency room and was taken to the operating room for laparoscopic cholecystectomy. Postoperatively she did well and had no bile in her drain but did have some bile staining around her dressing. A HIDA scan was obtained and failed to identify any sort of leak or abnormality of the biliary tree and she continued to make a non-, located postoperative recovery. The assumption is that the bile was coming from some spilled bile that had not been aspirated during the procedure. It was only coming from the one lateral drain site. There was no bile in the drain per se which was in the foramen of Winslow. Patient is given instructions about showering etc. and she can follow up in my office in 5 days for staple removal. She will go home on oral antibiotics and oral analgesics with instructions to return or call the office should she worsen at any point. Her drain has been removed.  Consults: None  Disposition:      Medication List    TAKE these medications   amoxicillin-clavulanate 875-125 MG tablet Commonly known as:  AUGMENTIN Take 1 tablet by mouth 2 (two) times daily.   Cetirizine HCl 10 MG Caps Take 1 tablet by mouth daily as needed.   Hydrocodone-Acetaminophen 5-300 MG Tabs Commonly known as:  VICODIN Take 1 tablet by mouth every 4 (four) hours as needed.   ibuprofen 200 MG tablet Commonly known as:  ADVIL,MOTRIN Take 400 mg by mouth every 6 (six) hours as needed.   multivitamin Tabs tablet Take 1 tablet by mouth daily.   OSCAL 500/200 D-3 500-200 MG-UNIT tablet Generic drug:  calcium-vitamin D Take 1 tablet by mouth daily.      Follow-up  Information    Phoebe Perch, MD Follow up in 5 day(s).   Specialty:  Surgery Contact information: [address] [place] [address] [phone]           Florene Glen, MD, FACS

## 2016-11-23 NOTE — Discharge Instructions (Signed)
Remove dressing in 24 hours. May shower in 24 hours. Leave staples in place. Resume all home medications. Follow-up with Dr. Burt Knack in 5 days.

## 2016-11-23 NOTE — Progress Notes (Signed)
4 Days Post-Op  Subjective: Patient feels much better today she is tolerating a regular diet has had bowel movements he is ready for discharge today.  Objective: Vital signs in last 24 hours: Temp:  [98 F (36.7 C)-99 F (37.2 C)] 99 F (37.2 C) (11/26 0415) Pulse Rate:  [71-83] 71 (11/26 0415) Resp:  [16-17] 16 (11/26 0415) BP: (138-146)/(62-74) 138/62 (11/26 0415) SpO2:  [98 %] 98 % (11/26 0415) Last BM Date: 11/23/16  Intake/Output from previous day: 11/25 0701 - 11/26 0700 In: 1728.3 [I.V.:1678.3; IV Piggyback:50] Out: [id number] [Urine:1000; Drains:190] Intake/Output this shift: Total I/O In: -  Out: 200 [Urine:200]  Physical exam:  Abdomen is soft nondistended nontympanitic and nontender no further bile staining and no bile in drain. No icterus no jaundice.  Lab Results: CBC   Recent Labs  11/21/16 0511 11/22/16 0629  WBC 19.5* 20.6*  HGB 13.1 12.6  HCT 36.4 35.0  PLT 249 228   BMET  Recent Labs  11/21/16 0511 11/22/16 0629  NA 137 136  K 2.7* 3.3*  CL 101 106  CO2 27 24  GLUCOSE 112* 99  BUN 15 16  CREATININE 0.55 0.40*  CALCIUM 8.2* 7.8*   PT/INR No results for input(s): LABPROT, INR in the last 72 hours. ABG No results for input(s): PHART, HCO3 in the last 72 hours.  Invalid input(s): PCO2, PO2  Studies/Results: Nm Hepatobiliary Liver Func  Result Date: 11/21/2016 CLINICAL DATA:  Recent cholecystectomy with concern for biliary leak. EXAM: NUCLEAR MEDICINE HEPATOBILIARY IMAGING TECHNIQUE: Sequential anterior images of the abdomen were obtained out to 60 minutes following intravenous administration of radiopharmaceutical. RADIOPHARMACEUTICALS:  5.36 mCi Tc-10m  Choletec IV COMPARISON:  ERCP November 19, 2016 FINDINGS: Liver uptake of radiotracer is normal. There is prompt visualization of small bowel, indicating patency of the common bile duct. No ectopic radiotracer is evident to suggest biliary leak. Gallbladder is absent. IMPRESSION: No ectopic  radiotracer to suggest biliary leak. Prompt visualization of small bowel, indicating patency of the common bile duct. Gallbladder absent. Electronically Signed   By: Lowella Grip III M.D.   On: 11/21/2016 10:23    Anti-infectives: Anti-infectives    Start     Dose/Rate Route Frequency Ordered Stop   11/18/16 1500  Ampicillin-Sulbactam (UNASYN) 3 g in sodium chloride 0.9 % 100 mL IVPB     3 g 200 mL/hr over 30 Minutes Intravenous Every 6 hours 11/18/16 1324     11/18/16 1130  piperacillin-tazobactam (ZOSYN) IVPB 3.375 g     3.375 g 100 mL/hr over 30 Minutes Intravenous  Once 11/18/16 1116 11/18/16 1153      Assessment/Plan: s/p Procedure(s): LAPAROSCOPIC CHOLECYSTECTOMY WITH INTRAOPERATIVE CHOLANGIOGRAM   No labs today but I anticipate that her white blood cell count will to continue to fall. She is ready for discharge today I will send her home on oral antibiotics and oral analgesics the drain has been removed this morning and she'll follow-up with me at the end of the week.  Florene Glen, MD, FACS  11/23/2016

## 2016-11-23 NOTE — Anesthesia Postprocedure Evaluation (Signed)
Anesthesia Post Note  Patient: Lori Thornton  Procedure(s) Performed: Procedure(s) (LRB): LAPAROSCOPIC CHOLECYSTECTOMY WITH INTRAOPERATIVE CHOLANGIOGRAM (N/A)  Patient location during evaluation: PACU Anesthesia Type: General Level of consciousness: awake and alert Pain management: pain level controlled Vital Signs Assessment: post-procedure vital signs reviewed and stable Respiratory status: spontaneous breathing, nonlabored ventilation, respiratory function stable and patient connected to nasal cannula oxygen Cardiovascular status: blood pressure returned to baseline and stable Postop Assessment: no signs of nausea or vomiting Anesthetic complications: no    Last Vitals:  Vitals:   11/22/16 2047 11/23/16 0415  BP: (!) 146/65 138/62  Pulse: 82 71  Resp: 16 16  Temp: 36.7 C 37.2 C    Last Pain:  Vitals:   11/23/16 0720  TempSrc:   PainSc: 0-No pain                 Martha Clan

## 2016-11-23 NOTE — Progress Notes (Signed)
Pt d/c home; d/c instructions reviewed w/ pt; pt understanding was verbalized; IV removed, catheter in tact, gauze dressing applied; all pt questions answered; dressing and incision sites CDI WDL at d/c;  pt verbalized that all pt belongings were accounted for; pt left unit via wheelchair accompanied by staff

## 2016-11-24 ENCOUNTER — Other Ambulatory Visit: Payer: Self-pay

## 2016-11-24 LAB — SURGICAL PATHOLOGY

## 2016-11-28 ENCOUNTER — Encounter: Payer: Self-pay | Admitting: Surgery

## 2016-11-28 ENCOUNTER — Ambulatory Visit (INDEPENDENT_AMBULATORY_CARE_PROVIDER_SITE_OTHER): Payer: BLUE CROSS/BLUE SHIELD | Admitting: Surgery

## 2016-11-28 VITALS — BP 173/80 | HR 75 | Temp 98.3°F | Ht 65.0 in | Wt 120.2 lb

## 2016-11-28 DIAGNOSIS — K8000 Calculus of gallbladder with acute cholecystitis without obstruction: Secondary | ICD-10-CM

## 2016-11-28 NOTE — Patient Instructions (Signed)
Please hold off on your Exercise class until 12/17/16. After this time, if you have pain while doing something stop and begin again in a few days. Soreness after working out is completely normal.  Please call our office with any questions or concerns.  Please do not submerge in a tub, hot tub, or pool until incisions are completely sealed.  Use sun block to incision area over the next year if this area will be exposed to sun. This helps decrease scarring.  You may return to work on 12/08/16 with a lifting restriction until 12/17/16. Please see note provided.  If you develop redness, drainage, or pain at incision sites- call our office immediately and speak with a nurse.

## 2016-11-28 NOTE — Progress Notes (Signed)
Outpatient postop visit  11/28/2016  Lori Thornton is an 70 y.o. female.    Procedure: Laparoscopic cholecystectomy for acute cholecystitis  CC: Well  HPI: Patient is doing so well she wants to go back to work next week. She has no nausea vomiting fevers or chills is having normal bowel movements and tolerating a diet.  Medications reviewed.    Physical Exam:  BP (!) 173/80   Pulse 75   Temp 98.3 F (36.8 C) (Oral)   Ht 5\' 5"  (1.651 m)   Wt 120 lb 3.2 oz (54.5 kg)   BMI 20.00 kg/m     PE: Wounds are clean no erythema no drainage staples are removed Steri-Strips are placed with benzoin. No icterus no jaundice    Assessment/Plan:  Patient doing very well recommend follow up on an as-needed basis staples are out and she can return to work with limitations on lifting and sports.  Florene Glen, MD, FACS

## 2017-08-11 ENCOUNTER — Other Ambulatory Visit: Payer: Self-pay | Admitting: Internal Medicine

## 2017-08-11 DIAGNOSIS — Z1231 Encounter for screening mammogram for malignant neoplasm of breast: Secondary | ICD-10-CM

## 2017-09-01 ENCOUNTER — Other Ambulatory Visit: Payer: Self-pay | Admitting: Internal Medicine

## 2017-09-01 ENCOUNTER — Ambulatory Visit
Admission: RE | Admit: 2017-09-01 | Discharge: 2017-09-01 | Disposition: A | Discharge status: Home / Self Care | Payer: BLUE CROSS/BLUE SHIELD | Source: Ambulatory Visit | Attending: Internal Medicine | Admitting: Internal Medicine

## 2017-09-01 DIAGNOSIS — Z1231 Encounter for screening mammogram for malignant neoplasm of breast: Secondary | ICD-10-CM | POA: Insufficient documentation

## 2018-05-19 ENCOUNTER — Other Ambulatory Visit: Payer: Self-pay

## 2018-05-19 ENCOUNTER — Emergency Department
Admission: EM | Admit: 2018-05-19 | Discharge: 2018-05-19 | Disposition: A | Discharge status: Home / Self Care | Payer: Medicare HMO | Attending: Emergency Medicine | Admitting: Emergency Medicine

## 2018-05-19 ENCOUNTER — Emergency Department: Payer: Medicare HMO

## 2018-05-19 DIAGNOSIS — Z8543 Personal history of malignant neoplasm of ovary: Secondary | ICD-10-CM | POA: Insufficient documentation

## 2018-05-19 DIAGNOSIS — Z79899 Other long term (current) drug therapy: Secondary | ICD-10-CM | POA: Insufficient documentation

## 2018-05-19 DIAGNOSIS — R600 Localized edema: Secondary | ICD-10-CM | POA: Insufficient documentation

## 2018-05-19 NOTE — Discharge Instructions (Addendum)
Your exam and ultrasound are consistent with leg edema, without evidence of a blood clot. You are to continue to dose the antibiotic as prescribed. Rest with the leg elevated, when seated. Follow-up with your provider or ortho specialist,as discussed. Return to the ED immediately for worsening pain, swelling, or difficulty breathing.

## 2018-05-19 NOTE — ED Triage Notes (Signed)
Pt c/o LLE swelling since Sunday, denies injury or pain, states she was seen by her PCP on Monday and told he would refer to an orthopedist . States the swelling in now down into the ankle .Marland Kitchen

## 2018-05-19 NOTE — ED Notes (Signed)

## 2018-05-20 NOTE — ED Provider Notes (Signed)
[facility] Emergency Department Provider Note ____________________________________________  Time seen: 1925  I have reviewed the triage vital signs and the nursing notes.  HISTORY  Chief Complaint  Leg Swelling   HPI Lori DELAHOZ is a 72 y.o. female presents to the ED accompanied by her husband, for evaluation of some swelling to the lower leg.  Patient was apparently seen initially by [facility] urgent care for some redness and swelling to the left knee.  She was found at that time to have a slightly elevated WBC at 15.  She was placed on a prescription doxycycline empirically and was to be referred to Ortho for ongoing management.  She has been tolerating the doxycycline as prescribed the last 2-1/2 days.  She called to the clinic today to note that she had some edema from the knee towards the foot.  Specifically at the ankle.  She denies any interim injury or trauma.  She was told to present to the ED for further evaluation.  Patient presents now denying significant pain to the knee or ankle joint.  She also reports improvement overall of her previous left knee swelling and redness.  She denies any chest pain, shortness of breath, or any anxiety.  She does not take any daily blood thinners or have a history of DVT/PE.  Past Medical History:  Diagnosis Date  . Cancer [facility]) 2009   ovarian    Patient Active Problem List   Diagnosis Date Noted  . Acute cholecystitis 11/18/2016  . Cholecystitis   . Pseudophakia of left eye 05/19/2016  . Pseudophakia of right eye 04/28/2016  . Osteopenia 07/15/2014  . 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 ABDOMINAL HYSTERECTOMY    . BREAST BIOPSY Right 2014   stereo bx. negative  . BREAST EXCISIONAL BIOPSY Right 1974 and 1985   benign  . CHOLECYSTECTOMY N/A 11/19/2016   Procedure: LAPAROSCOPIC CHOLECYSTECTOMY WITH INTRAOPERATIVE CHOLANGIOGRAM;   Surgeon: Florene Glen, MD;  Location: [facility] ORS;  Service: General;  Laterality: N/A;    Prior to Admission medications   Medication Sig Start Date End Date Taking? Authorizing Provider  amoxicillin-clavulanate (AUGMENTIN) 875-125 MG tablet Take 1 tablet by mouth 2 (two) times daily. 11/23/16   Florene Glen, MD  calcium-vitamin D (OSCAL 500/200 D-3) 500-200 MG-UNIT tablet Take 1 tablet by mouth daily.    [provider]  Cetirizine HCl 10 MG CAPS Take 1 tablet by mouth daily as needed.    [provider]  ibuprofen (ADVIL,MOTRIN) 200 MG tablet Take 400 mg by mouth every 6 (six) hours as needed.    [provider]  multivitamin (ONE-A-DAY MEN'S) TABS tablet Take 1 tablet by mouth daily.    [provider]    Allergies Patient has no known allergies.  Family History  Problem Relation Age of Onset  . Hypertension Father   . Breast cancer Neg Hx     Social History Social History   Tobacco Use  . Smoking status: Never Smoker  . Smokeless tobacco: Never Used  Substance Use Topics  . Alcohol use: No  . Drug use: No    Review of Systems  Constitutional: Negative for fever. Eyes: Negative for visual changes. ENT: Negative for sore throat. Cardiovascular: Negative for chest pain. Respiratory: Negative for shortness of breath. Gastrointestinal: Negative for abdominal pain, vomiting and diarrhea. Genitourinary: Negative for dysuria. Musculoskeletal: Negative for back pain.  Left lower  extremity swelling as above. Skin: Negative for rash. Neurological: Negative for headaches, focal weakness or numbness. ____________________________________________  PHYSICAL EXAM:  VITAL SIGNS: ED Triage Vitals  Enc Vitals Group     BP 05/19/18 1648 (!) 162/98     Pulse Rate 05/19/18 1648 99     Resp 05/19/18 1648 16     Temp 05/19/18 1648 98.8 F (37.1 C)     Temp Source 05/19/18 1648 Oral     SpO2 05/19/18 1648 98 %     Weight 05/19/18 1649 131  lb (59.4 kg)     Height 05/19/18 1649 5\' 5"  (1.651 m)     Head Circumference --      Peak Flow --      Pain Score 05/19/18 1649 0     Pain Loc --      Pain Edu? --      Excl. in [place]? --     Constitutional: Alert and oriented. Well appearing and in no distress. Head: Normocephalic and atraumatic. Eyes: Conjunctivae are normal. Normal extraocular movements Cardiovascular: Normal rate, regular rhythm. Normal distal pulses.  Normal capillary refill distally.  Left lower extremity shows 1+ pitting edema from the knee to the ankle joint. Respiratory: Normal respiratory effort. No wheezes/rales/rhonchi. Gastrointestinal: Soft and nontender. No distention. Musculoskeletal: Left knee without any obvious deformity or dislocation.  There is some soft tissue swelling about the knee joint but is nontender to palpation.  Patient's exam is overall benign without any signs of internal derangement.  She has no popliteal space fullness or calf/Achilles tenderness.  The ankle joint is also intact without any crepitus or laxity.  Nontender with normal range of motion in all extremities.  Neurologic:  Normal gait without ataxia. Normal speech and language. No gross focal neurologic deficits are appreciated. Skin:  Skin is warm, dry and intact. No rash noted.  No erythema, edema, or warmth is noted.  No induration or cellulitis is appreciated. Psychiatric: Mood and affect are normal. Patient exhibits appropriate insight and judgment. ___________________________________________   RADIOLOGY  Doppler/US LLE IMPRESSION: No evidence of deep venous thrombosis in the left lower extremity. ____________________________________________  INITIAL IMPRESSION / ASSESSMENT AND PLAN / ED COURSE  DDX: DVT, erysipelas, joint effusion.  Patient with ED evaluation of left lower extremity edema with a history of a recent cellulitis or septic joint to the left knee.  Patient exam is overall benign.  She reports no subjective  complaints of pain to the knee or lower extremity.  She also is noting improvement in her overall erythema and swelling to the left knee.  Patient and her husband are reassured by the negative DVT study.  She has symptoms consistent with some dependent edema on the left lower extremity.  She is advised to continue to dose the antibiotic as previously prescribed.  She should rest with the leg elevated when seated.  She will follow-up with [facility] for ongoing management.  Return precautions have been reviewed and the patient and her husband verbalized understanding. ____________________________________________  FINAL CLINICAL IMPRESSION(S) / ED DIAGNOSES  Final diagnoses:  Leg edema      Titianna Loomis, Dannielle Karvonen, PA-C 05/20/18 2334    Hinda Kehr, MD 05/22/18 [phone]

## 2019-08-15 ENCOUNTER — Other Ambulatory Visit: Payer: Self-pay | Admitting: Internal Medicine

## 2019-08-15 DIAGNOSIS — Z1231 Encounter for screening mammogram for malignant neoplasm of breast: Secondary | ICD-10-CM

## 2019-09-19 ENCOUNTER — Ambulatory Visit
Admission: RE | Admit: 2019-09-19 | Discharge: 2019-09-19 | Disposition: A | Discharge status: Home / Self Care | Payer: Medicare HMO | Source: Ambulatory Visit | Attending: Internal Medicine | Admitting: Internal Medicine

## 2019-09-19 DIAGNOSIS — Z1231 Encounter for screening mammogram for malignant neoplasm of breast: Secondary | ICD-10-CM | POA: Insufficient documentation

## 2019-12-07 DIAGNOSIS — M159 Polyosteoarthritis, unspecified: Secondary | ICD-10-CM | POA: Insufficient documentation

## 2019-12-12 ENCOUNTER — Other Ambulatory Visit: Payer: Self-pay

## 2019-12-12 ENCOUNTER — Ambulatory Visit: Payer: Medicare HMO | Attending: Internal Medicine | Admitting: Occupational Therapy

## 2019-12-12 ENCOUNTER — Encounter: Payer: Self-pay | Admitting: Occupational Therapy

## 2019-12-12 DIAGNOSIS — M79641 Pain in right hand: Secondary | ICD-10-CM | POA: Insufficient documentation

## 2019-12-12 DIAGNOSIS — M25641 Stiffness of right hand, not elsewhere classified: Secondary | ICD-10-CM | POA: Insufficient documentation

## 2019-12-12 DIAGNOSIS — M6281 Muscle weakness (generalized): Secondary | ICD-10-CM | POA: Insufficient documentation

## 2019-12-12 NOTE — Patient Instructions (Signed)
Moist heat 3 x day  Tendon glides - block each step -and do not force - AROM  Composite fist to 3 cm foam block  Not pain or triggering  Opposition to all digits  10reps  Joint protection principles  AE info provided  And modifications in the kitchen - info provided

## 2019-12-12 NOTE — Therapy (Signed)
[place] [facility] [address] [place], [address] Phone: [phone]   Fax:  [phone]  Occupational Therapy Evaluation  Patient Details  Name: Lori Thornton MRN: [id number] Date of Birth: 01/16/1946 Referring Provider (OT): Dr Meda Coffee   Encounter Date: 12/12/2019  OT End of Session - 12/12/19 1650    Visit Number  1    Number of Visits  4    Date for OT Re-Evaluation  01/23/20    OT Start Time  0818    OT Stop Time  0930    OT Time Calculation (min)  72 min    Activity Tolerance  Patient tolerated treatment well    Behavior During Therapy  [facility] for tasks assessed/performed       Past Medical History:  Diagnosis Date  . Cancer [facility]) 2009   ovarian    Past Surgical History:  Procedure Laterality Date  . ABDOMINAL HYSTERECTOMY    . BREAST BIOPSY Right 2014   stereo bx. negative  . BREAST EXCISIONAL BIOPSY Right 1974 and 1985   benign  . CHOLECYSTECTOMY N/A 11/19/2016   Procedure: LAPAROSCOPIC CHOLECYSTECTOMY WITH INTRAOPERATIVE CHOLANGIOGRAM;  Surgeon: Florene Glen, MD;  Location: [facility] ORS;  Service: General;  Laterality: N/A;    There were no vitals filed for this visit.  Subjective Assessment - 12/12/19 1639    Subjective   My R hand fingers got stiff - notice it about the last month or 2 - cannot remember anything causing it - my ring finger was locking at times - but before that - or 6 months ago -I  could touch my palm - did hurt my middle finger years ago    Pertinent History  Pt has history of severe R thumb and fingers - with stiffness - pt report being able to touch palm about 6 months ago - but notice the last month cannot make fist and getting worse - stiffness more than pain - had some clicking in 4th digit    Patient Stated Goals  Want to be able to make fist -to grip objects, hold onto things, stir food, hold pots , turn doorknob - cut food better    Currently in Pain?  Yes    Pain  Score  4     Pain Location  Hand    Pain Orientation  Right    Pain Descriptors / Indicators  Aching;Tightness   stiff   Pain Type  Chronic pain    Pain Onset  More than a month ago    Aggravating Factors   bending fingers        [facility] OT Assessment - 12/12/19 0001      Assessment   Medical Diagnosis  OA of R hand     Referring Provider (OT)  Dr Meda Coffee    Onset Date/Surgical Date  09/29/19    Hand Dominance  Right      Home  Environment   Lives With  Spouse      Prior Function   Vocation  Retired    Leisure  Likes to garden, read, play on IPAD, cook and house work       Strength   Right Hand Grip (lbs)  19    Right Hand Lateral Pinch  12.5 lbs    Right Hand 3 Point Pinch  8 lbs    Left Hand Grip (lbs)  19    Left Hand Lateral Pinch  11 lbs  Left Hand 3 Point Pinch  9 lbs      Right Hand AROM   R Thumb Opposition to Index  --   thumb ADD at thumb- cannot make oval - touch side of thumb    R Index  MCP 0-90  70 Degrees    R Index PIP 0-100  95 Degrees    R Long  MCP 0-90  70 Degrees    R Long PIP 0-100  75 Degrees    R Ring  MCP 0-90  65 Degrees    R Ring PIP 0-100  80 Degrees    R Little  MCP 0-90  80 Degrees    R Little PIP 0-100  90 Degrees      Left Hand AROM   L Index  MCP 0-90  75 Degrees    L Long  MCP 0-90  80 Degrees    L Ring  MCP 0-90  82 Degrees    L Little  MCP 0-90  90 Degrees               OT Treatments/Exercises (OP) - 12/12/19 0001      RUE Paraffin   Number Minutes Paraffin  8 Minutes    RUE Paraffin Location  Hand    Comments  prior to review of HEP      prior to HEP - showed in session increase AROM in digits - after doing HEP    Pt can use Moist heat at home  3 x day  Tendon glides - block each step -and do not force - AROM  Composite fist to 3 cm foam block  Not pain or triggering  Opposition to all digits  10reps  Joint protection principles  AE info provided  And modifications in the kitchen - info provided        OT Education - 12/12/19 1650    Education Details  findings of eval and HEP    Person(s) Educated  Patient    Methods  Explanation;Demonstration;Tactile cues;Verbal cues;Handout    Comprehension  Verbal cues required;Returned demonstration;Verbalized understanding       OT Short Term Goals - 12/12/19 1657      OT SHORT TERM GOAL #1   Title  Pt to show progres with HEP to increase AROM in R hand digits without increase symptoms    Baseline  no knowledge of HEP to increase AROM    Time  3    Period  Weeks    Status  New    Target Date  12/26/19        OT Long Term Goals - 12/12/19 1659      OT LONG TERM GOAL #1   Title  Pt R hand digits flexion increase for pt to touch palm to grip cylinder objecs like utencils, pot handles, steering wheel , utencils    Baseline  Mc's 65 to 80 ; PIP's 80 and 85 at 3rd and 4th    Time  6    Period  Weeks    Status  New    Target Date  01/23/20      OT LONG TERM GOAL #2   Title  R grip strength increase with more than 5 lbs to hold pot , turn doorknob and carry more than 8 lbs without inrease symptoms    Baseline  grip R dominant hand 19 ,  L 30 lbs    Time  6    Period  Weeks  Status  New    Target Date  01/23/20      OT LONG TERM GOAL #3   Title  Pt to verbalize and show 3 joint protection and AE to increase ease of use of R hand and decrease pain at 4th digit    Baseline  Pt tender 4th A1pulley -and had before stiffness few month ago some lockingat 4th A1pulley - no knowledge on joint protection    Time  6    Period  Weeks    Status  New    Target Date  01/23/20            Plan - 12/12/19 1651    Clinical Impression Statement  Pt present at OT eval with diagnosis of OA in R hand more than L - [facility] , pain with repetitive lateral grip , pt show severe stiffness in R hand with 3rd and 4th the worse - pt do show some tenderness over A1pulley of 4th - decrease grip strength and pain with Flexion of  digits - all limiting her functional use of R hand  in ADL's and IADL's - pt can benefit from OT services    OT Occupational Profile and History  Problem Focused Assessment - Including review of records relating to presenting problem    Occupational performance deficits (Please refer to evaluation for details):  ADL's;IADL's;Play;Leisure;Social Participation    Body Structure / Function / Physical Skills  ADL;Flexibility;ROM;UE functional use;Decreased knowledge of use of DME;FMC;Dexterity;Pain;Strength    Rehab Potential  Good    Clinical Decision Making  Limited treatment options, no task modification necessary    Comorbidities Affecting Occupational Performance:  May have comorbidities impacting occupational performance   OA chronic condition   Modification or Assistance to Complete Evaluation   No modification of tasks or assist necessary to complete eval    OT Frequency  --   1 x 1 wk, biweekly   OT Duration  6 weeks    OT Treatment/Interventions  Self-care/ADL training;Iontophoresis;Therapeutic exercise;Patient/family education;Splinting;Paraffin;Manual Therapy;Passive range of motion;DME and/or AE instruction    Plan  assess progress of HEP and change as needed    OT Home Exercise Plan  see pt instruction    Consulted and Agree with Plan of Care  Patient       Patient will benefit from skilled therapeutic intervention in order to improve the following deficits and impairments:   Body Structure / Function / Physical Skills: ADL, Flexibility, ROM, UE functional use, Decreased knowledge of use of DME, FMC, Dexterity, Pain, Strength       Visit Diagnosis: Stiffness of right hand, not elsewhere classified - Plan: Ot plan of care cert/re-cert  Muscle weakness (generalized) - Plan: Ot plan of care cert/re-cert  Pain in right hand - Plan: Ot plan of care cert/re-cert    Problem List Patient Active Problem List   Diagnosis Date Noted  . Acute cholecystitis 11/18/2016  .  Cholecystitis   . Pseudophakia of left eye 05/19/2016  . Pseudophakia of right eye 04/28/2016  . Osteopenia 07/15/2014  . 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 [place], [address] Phone: [phone]   Fax:  [phone]  Name: Lori Thornton MRN: [id number] Date of Birth: 09/14/1946

## 2019-12-15 ENCOUNTER — Ambulatory Visit: Payer: Medicare HMO | Admitting: Occupational Therapy

## 2019-12-15 ENCOUNTER — Other Ambulatory Visit: Payer: Self-pay

## 2019-12-15 DIAGNOSIS — M79641 Pain in right hand: Secondary | ICD-10-CM

## 2019-12-15 DIAGNOSIS — M6281 Muscle weakness (generalized): Secondary | ICD-10-CM

## 2019-12-15 DIAGNOSIS — M25641 Stiffness of right hand, not elsewhere classified: Secondary | ICD-10-CM | POA: Diagnosis not present

## 2019-12-15 NOTE — Patient Instructions (Signed)
Change HEP -to intrinsic fist stretch during her paraffin  Then block intrinsic fist 10 reps  AAROM gentle on MC flexion  Composite fist to blue foam block - 3 cm and 2 cm - until pull is less than 2-3/10  Then can do fisting to palm  Cont with opposition  And joint protection and AE

## 2019-12-15 NOTE — Therapy (Signed)
[place] [facility] [address] [place], [address] Phone: [phone]   Fax:  [phone]  Occupational Therapy Treatment  Patient Details  Name: Lori Thornton MRN: [id number] Date of Birth: 1946/09/06 Referring Provider (OT): Dr Meda Coffee   Encounter Date: 12/15/2019  OT End of Session - 12/15/19 0908    Visit Number  2    Number of Visits  4    Date for OT Re-Evaluation  01/23/20    OT Start Time  0910    OT Stop Time  0944    OT Time Calculation (min)  34 min    Activity Tolerance  Patient tolerated treatment well    Behavior During Therapy  [facility] for tasks assessed/performed       Past Medical History:  Diagnosis Date  . Cancer [facility]) 2009   ovarian    Past Surgical History:  Procedure Laterality Date  . ABDOMINAL HYSTERECTOMY    . BREAST BIOPSY Right 2014   stereo bx. negative  . BREAST EXCISIONAL BIOPSY Right 1974 and 1985   benign  . CHOLECYSTECTOMY N/A 11/19/2016   Procedure: LAPAROSCOPIC CHOLECYSTECTOMY WITH INTRAOPERATIVE CHOLANGIOGRAM;  Surgeon: Florene Glen, MD;  Location: [facility] ORS;  Service: General;  Laterality: N/A;    There were no vitals filed for this visit.  Subjective Assessment - 12/15/19 0949    Subjective   I done my exercises - used my paraffin one time- did order one -feels good- I think they are bending little better - but still tight and stiff    Pertinent History  Pt has history of severe R thumb and fingers - with stiffness - pt report being able to touch palm about 6 months ago - but notice the last month cannot make fist and getting worse - stiffness more than pain - had some clicking in 4th digit    Patient Stated Goals  Want to be able to make fist -to grip objects, hold onto things, stir food, hold pots , turn doorknob - cut food better    Currently in Pain?  Yes    Pain Score  3     Pain Location  Hand    Pain Orientation  Right    Pain Descriptors / Indicators   Tightness;Aching    Pain Type  Chronic pain    Pain Onset  More than a month ago    Aggravating Factors   making fist         [facility] OT Assessment - 12/15/19 0001      Right Hand AROM   R Index  MCP 0-90  80 Degrees    R Index PIP 0-100  95 Degrees    R Long  MCP 0-90  80 Degrees    R Long PIP 0-100  75 Degrees    R Ring  MCP 0-90  78 Degrees    R Ring PIP 0-100  80 Degrees    R Little  MCP 0-90  82 Degrees    R Little PIP 0-100  95 Degrees      Pt showed great progress from [facility] - MC flexion more than PIP   see flow sheet  PROM done and pt can still gain more PIP flexion          OT Treatments/Exercises (OP) - 12/15/19 0001      RUE Paraffin   Number Minutes Paraffin  8 Minutes    RUE Paraffin Location  Hand  Comments  intrinsic fist stretch - prior to AROM and soft tissue       soft tissue mobs done to [facility] spreads and CT spreads -soft tissue done on volar digits graston tool nr 2 brushing  And gentle traction on digits -without pain and lateral bands massage  Prior to AROM   Blocked AROM for intrinsic fist  AAROM for MC flexion  Pt to do changes to HEP  And then composite - no forcing to 3 and 2 cm foam block - pain free  Pt made great progress in session at PIP's  Still tender over A1 pulley for 3rd and 4th  Cont with opposition to all digits  And joint protection and AE          OT Education - 12/15/19 0908    Education Details  changes to HEP    Person(s) Educated  Patient    Methods  Explanation;Demonstration;Tactile cues;Verbal cues;Handout    Comprehension  Verbal cues required;Returned demonstration;Verbalized understanding       OT Short Term Goals - 12/12/19 1657      OT SHORT TERM GOAL #1   Title  Pt to show progres with HEP to increase AROM in R hand digits without increase symptoms    Baseline  no knowledge of HEP to increase AROM    Time  3    Period  Weeks    Status  New    Target Date  12/26/19        OT Long Term Goals  - 12/12/19 1659      OT LONG TERM GOAL #1   Title  Pt R hand digits flexion increase for pt to touch palm to grip cylinder objecs like utencils, pot handles, steering wheel , utencils    Baseline  Mc's 65 to 80 ; PIP's 80 and 85 at 3rd and 4th    Time  6    Period  Weeks    Status  New    Target Date  01/23/20      OT LONG TERM GOAL #2   Title  R grip strength increase with more than 5 lbs to hold pot , turn doorknob and carry more than 8 lbs without inrease symptoms    Baseline  grip R dominant hand 19 ,  L 30 lbs    Time  6    Period  Weeks    Status  New    Target Date  01/23/20      OT LONG TERM GOAL #3   Title  Pt to verbalize and show 3 joint protection and AE to increase ease of use of R hand and decrease pain at 4th digit    Baseline  Pt tender 4th A1pulley -and had before stiffness few month ago some lockingat 4th A1pulley - no knowledge on joint protection    Time  6    Period  Weeks    Status  New    Target Date  01/23/20            Plan - 12/15/19 0909    Clinical Impression Statement  Pt made great progress the last 3 days with doing her HEP - more in [place] flexion then PIP flexion -but this date in session improved greatly in PIP flexion -changed her HEP and pt has paraffin bath at home - pt to do her HEP for week and will follow up in about 10 days - pt to keep pain under 2/10 and without  3rd or 4th triggering - pt cont to be tender over A1pulley - about 4/10    OT Occupational Profile and History  Problem Focused Assessment - Including review of records relating to presenting problem    Occupational performance deficits (Please refer to evaluation for details):  ADL's;IADL's;Play;Leisure;Social Participation    Body Structure / Function / Physical Skills  ADL;Flexibility;ROM;UE functional use;Decreased knowledge of use of DME;FMC;Dexterity;Pain;Strength    Rehab Potential  Good    Clinical Decision Making  Limited treatment options, no task modification necessary     Comorbidities Affecting Occupational Performance:  May have comorbidities impacting occupational performance    Modification or Assistance to Complete Evaluation   No modification of tasks or assist necessary to complete eval    OT Frequency  --   biweekly   OT Duration  6 weeks    OT Treatment/Interventions  Self-care/ADL training;Iontophoresis;Therapeutic exercise;Patient/family education;Splinting;Paraffin;Manual Therapy;Passive range of motion;DME and/or AE instruction    Plan  assess progress of HEP and change as needed    OT Home Exercise Plan  see pt instruction    Consulted and Agree with Plan of Care  Patient       Patient will benefit from skilled therapeutic intervention in order to improve the following deficits and impairments:   Body Structure / Function / Physical Skills: ADL, Flexibility, ROM, UE functional use, Decreased knowledge of use of DME, FMC, Dexterity, Pain, Strength       Visit Diagnosis: Muscle weakness (generalized)  Stiffness of right hand, not elsewhere classified  Pain in right hand    Problem List Patient Active Problem List   Diagnosis Date Noted  . Acute cholecystitis 11/18/2016  . Cholecystitis   . Pseudophakia of left eye 05/19/2016  . Pseudophakia of right eye 04/28/2016  . Osteopenia 07/15/2014  . 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 [place], [address] Phone: [phone]   Fax:  [phone]  Name: Lori Thornton MRN: [id number] Date of Birth: 1946/05/17

## 2019-12-27 ENCOUNTER — Other Ambulatory Visit: Payer: Self-pay

## 2019-12-27 ENCOUNTER — Ambulatory Visit: Payer: Medicare HMO | Admitting: Occupational Therapy

## 2019-12-27 DIAGNOSIS — M25641 Stiffness of right hand, not elsewhere classified: Secondary | ICD-10-CM

## 2019-12-27 DIAGNOSIS — M79641 Pain in right hand: Secondary | ICD-10-CM

## 2019-12-27 DIAGNOSIS — M6281 Muscle weakness (generalized): Secondary | ICD-10-CM

## 2019-12-27 NOTE — Therapy (Signed)
[place] [facility] [address] [place], [address] Phone: [phone]   Fax:  [phone]  Occupational Therapy Treatment  Patient Details  Name: Lori Thornton MRN: [id number] Date of Birth: 1946-04-09 Referring Provider (OT): Dr Meda Coffee   Encounter Date: 12/27/2019  OT End of Session - 12/27/19 1000    Visit Number  3    Number of Visits  4    Date for OT Re-Evaluation  01/23/20    OT Start Time  0945    OT Stop Time  1032    OT Time Calculation (min)  47 min    Activity Tolerance  Patient tolerated treatment well    Behavior During Therapy  [facility] for tasks assessed/performed       Past Medical History:  Diagnosis Date  . Cancer [facility]) 2009   ovarian    Past Surgical History:  Procedure Laterality Date  . ABDOMINAL HYSTERECTOMY    . BREAST BIOPSY Right 2014   stereo bx. negative  . BREAST EXCISIONAL BIOPSY Right 1974 and 1985   benign  . CHOLECYSTECTOMY N/A 11/19/2016   Procedure: LAPAROSCOPIC CHOLECYSTECTOMY WITH INTRAOPERATIVE CHOLANGIOGRAM;  Surgeon: Florene Glen, MD;  Location: [facility] ORS;  Service: General;  Laterality: N/A;    There were no vitals filed for this visit.  Subjective Assessment - 12/27/19 0957    Subjective   Motion getting better and using it - but my middle and ring finger get stuck down - need to unlock it - pain mostly in those 2 fingers when gripping - but otherwise better    Pertinent History  Pt has history of severe R thumb and fingers - with stiffness - pt report being able to touch palm about 6 months ago - but notice the last month cannot make fist and getting worse - stiffness more than pain - had some clicking in 4th digit    Patient Stated Goals  Want to be able to make fist -to grip objects, hold onto things, stir food, hold pots , turn doorknob - cut food better    Currently in Pain?  Yes    Pain Score  4     Pain Location  Hand    Pain Orientation  Right    Pain  Descriptors / Indicators  Tightness;Aching    Pain Type  Chronic pain    Pain Onset  More than a month ago    Pain Frequency  Intermittent    Aggravating Factors   making fist       Pt showed great progress from [facility] - but 4th and 3rd triggering one time in session during composite fist - see flow sheet      [facility] OT Assessment - 12/27/19 0001      Right Hand AROM   R Index  MCP 0-90  75 Degrees    R Index PIP 0-100  100 Degrees    R Long  MCP 0-90  80 Degrees    R Long PIP 0-100  90 Degrees    R Ring  MCP 0-90  80 Degrees    R Ring PIP 0-100  85 Degrees    R Little  MCP 0-90  85 Degrees    R Little PIP 0-100  100 Degrees                 OT Treatments/Exercises (OP) - 12/27/19 0001      Iontophoresis   Type of Iontophoresis  Dexamethasone    Location  R 4th A1pulley     Dose  small patch     Time  19      RUE Paraffin   Number Minutes Paraffin  8 Minutes    RUE Paraffin Location  Hand    Comments  soft tissue mobs prior to ROM        soft tissue mobs done to [facility] spreads and CT spreads -soft tissue done on volar digits graston tool nr 2 brushing  And gentle traction on digits -without pain and lateral bands massage  Prior to AROM   Blocked AROM for intrinsic fist  AAROM for MC flexion    And then composite - no forcing to 3 and 2 cm foam block - pain free    Still tender over A1 pulley for 3rd and 4th  Cont with opposition to all digits  And joint protection and AE  ionto done this date -  Pt ed on what to expect and skin check done prior and afterwards  contact Dr Rudene Christians - pt seen him last year - pt do not want surgery - recommend shot from Dr Candelaria Stagers - pt to contact his office          OT Education - 12/27/19 1000    Education Details  HEP and POC - recommend shot from Ortho for trigger fingers    Person(s) Educated  Patient    Methods  Explanation;Demonstration;Tactile cues;Verbal cues;Handout    Comprehension  Verbal cues  required;Returned demonstration;Verbalized understanding       OT Short Term Goals - 12/12/19 1657      OT SHORT TERM GOAL #1   Title  Pt to show progres with HEP to increase AROM in R hand digits without increase symptoms    Baseline  no knowledge of HEP to increase AROM    Time  3    Period  Weeks    Status  New    Target Date  12/26/19        OT Long Term Goals - 12/12/19 1659      OT LONG TERM GOAL #1   Title  Pt R hand digits flexion increase for pt to touch palm to grip cylinder objecs like utencils, pot handles, steering wheel , utencils    Baseline  Mc's 65 to 80 ; PIP's 80 and 85 at 3rd and 4th    Time  6    Period  Weeks    Status  New    Target Date  01/23/20      OT LONG TERM GOAL #2   Title  R grip strength increase with more than 5 lbs to hold pot , turn doorknob and carry more than 8 lbs without inrease symptoms    Baseline  grip R dominant hand 19 ,  L 30 lbs    Time  6    Period  Weeks    Status  New    Target Date  01/23/20      OT LONG TERM GOAL #3   Title  Pt to verbalize and show 3 joint protection and AE to increase ease of use of R hand and decrease pain at 4th digit    Baseline  Pt tender 4th A1pulley -and had before stiffness few month ago some lockingat 4th A1pulley - no knowledge on joint protection    Time  6    Period  Weeks    Status  New  Target Date  01/23/20            Plan - 12/27/19 1000    Clinical Impression Statement  Pt made great progress in the last 2 wks since starting therapy - but pt trigger fingers now limiting her progress in last degrees in 3rd and 4th digit- tender over A1pulley - recommend for pt to call Dr Candelaria Stagers for shots    OT Occupational Profile and History  Problem Focused Assessment - Including review of records relating to presenting problem    Occupational performance deficits (Please refer to evaluation for details):  ADL's;IADL's;Play;Leisure;Social Participation    Body Structure / Function /  Physical Skills  ADL;Flexibility;ROM;UE functional use;Decreased knowledge of use of DME;FMC;Dexterity;Pain;Strength    Rehab Potential  Good    Clinical Decision Making  Limited treatment options, no task modification necessary    Comorbidities Affecting Occupational Performance:  May have comorbidities impacting occupational performance    Modification or Assistance to Complete Evaluation   No modification of tasks or assist necessary to complete eval    OT Frequency  1x / week    OT Duration  6 weeks    OT Treatment/Interventions  Self-care/ADL training;Iontophoresis;Therapeutic exercise;Patient/family education;Splinting;Paraffin;Manual Therapy;Passive range of motion;DME and/or AE instruction    Plan  progress and ionto if needed - except if pt had shot    OT Home Exercise Plan  see pt instruction    Consulted and Agree with Plan of Care  Patient       Patient will benefit from skilled therapeutic intervention in order to improve the following deficits and impairments:   Body Structure / Function / Physical Skills: ADL, Flexibility, ROM, UE functional use, Decreased knowledge of use of DME, FMC, Dexterity, Pain, Strength       Visit Diagnosis: Muscle weakness (generalized)  Stiffness of right hand, not elsewhere classified  Pain in right hand    Problem List Patient Active Problem List   Diagnosis Date Noted  . Acute cholecystitis 11/18/2016  . Cholecystitis   . Pseudophakia of left eye 05/19/2016  . Pseudophakia of right eye 04/28/2016  . Osteopenia 07/15/2014  . 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, [place], [address] Phone: [phone]   Fax:  [phone]  Name: Lori Thornton MRN: [id number] Date of Birth: 12-11-1946

## 2019-12-27 NOTE — Patient Instructions (Signed)
Same HEP - but avoid tight and prolonged fist

## 2020-01-03 ENCOUNTER — Other Ambulatory Visit: Payer: Self-pay

## 2020-01-03 ENCOUNTER — Ambulatory Visit: Payer: Medicare HMO | Attending: Internal Medicine | Admitting: Occupational Therapy

## 2020-01-03 DIAGNOSIS — M79641 Pain in right hand: Secondary | ICD-10-CM | POA: Diagnosis present

## 2020-01-03 DIAGNOSIS — M25641 Stiffness of right hand, not elsewhere classified: Secondary | ICD-10-CM | POA: Insufficient documentation

## 2020-01-03 DIAGNOSIS — M6281 Muscle weakness (generalized): Secondary | ICD-10-CM | POA: Diagnosis present

## 2020-01-03 NOTE — Therapy (Signed)
[place] [facility] [address] [place], [address] Phone: [phone]   Fax:  [phone]  Occupational Therapy Treatment  Patient Details  Name: Lori Thornton MRN: [id number] Date of Birth: December 12, 1946 Referring Provider (OT): Dr Meda Coffee   Encounter Date: 01/03/2020  OT End of Session - 01/03/20 0917    Visit Number  4    Number of Visits  10    Date for OT Re-Evaluation  01/23/20    OT Start Time  0732    OT Stop Time  0834    OT Time Calculation (min)  62 min    Activity Tolerance  Patient tolerated treatment well    Behavior During Therapy  [facility] for tasks assessed/performed       Past Medical History:  Diagnosis Date  . Cancer [facility]) 2009   ovarian    Past Surgical History:  Procedure Laterality Date  . ABDOMINAL HYSTERECTOMY    . BREAST BIOPSY Right 2014   stereo bx. negative  . BREAST EXCISIONAL BIOPSY Right 1974 and 1985   benign  . CHOLECYSTECTOMY N/A 11/19/2016   Procedure: LAPAROSCOPIC CHOLECYSTECTOMY WITH INTRAOPERATIVE CHOLANGIOGRAM;  Surgeon: Florene Glen, MD;  Location: [facility] ORS;  Service: General;  Laterality: N/A;    There were no vitals filed for this visit.  Subjective Assessment - 01/03/20 0914    Subjective   I was able to see DR Candelaria Stagers last week and got 2 shots in my R hand - middle finger little better- and they locking less - still ittle bruise her my ring finger    Pertinent History  Pt has history of severe R thumb and fingers - with stiffness - pt report being able to touch palm about 6 months ago - but notice the last month cannot make fist and getting worse - stiffness more than pain - had some clicking in 4th digit    Patient Stated Goals  Want to be able to make fist -to grip objects, hold onto things, stir food, hold pots , turn doorknob - cut food better    Currently in Pain?  Yes    Pain Score  2     Pain Location  Hand    Pain Orientation  Right    Pain Descriptors  / Indicators  Tightness;Tender    Pain Type  Chronic pain    Pain Onset  More than a month ago    Aggravating Factors   tender over A1pulley of 4th         [facility] OT Assessment - 01/03/20 0001      Right Hand AROM   R Index  MCP 0-90  75 Degrees    R Index PIP 0-100  100 Degrees    R Long  MCP 0-90  80 Degrees    R Long PIP 0-100  90 Degrees    R Ring  MCP 0-90  75 Degrees    R Ring PIP 0-100  90 Degrees    R Little  MCP 0-90  85 Degrees    R Little PIP 0-100  100 Degrees               OT Treatments/Exercises (OP) - 01/03/20 0001      Iontophoresis   Type of Iontophoresis  Dexamethasone    Location  R 4th A1pulley     Dose  small patch     Time  19      RUE Contrast  [place]   Time  9 minutes    Comments  R hand prior to soft tissue and AROM          soft tissue mobs done to [facility] spreads and CT spreads -soft tissue done on volar digits graston tool nr 2 brushing  And gentle traction on digits -without pain and lateral bands massage  Prior to AROM   Blocked AROM for intrinsic fist  AAROM for MC flexion    And then  PROM composite  Flexion - pt to do at home - not composite AROM  - pain free    Still tender over A1 pulley  4th  Cont with opposition to all digits  And joint protection and AE ionto done this date on 4th A1 pulley  -  Pt ed on what to expect and skin check done prior and afterwards   MC block splint fabricated to use on 4th digit during any composite flexion activities at home         OT Education - 01/03/20 0916    Education Details  HEP changes and MC block splint use    Person(s) Educated  Patient    Methods  Explanation;Demonstration;Tactile cues;Verbal cues;Handout    Comprehension  Verbal cues required;Returned demonstration;Verbalized understanding       OT Short Term Goals - 12/12/19 1657      OT SHORT TERM GOAL #1   Title  Pt to show progres with HEP to increase AROM in R hand digits without increase symptoms     Baseline  no knowledge of HEP to increase AROM    Time  3    Period  Weeks    Status  New    Target Date  12/26/19        OT Long Term Goals - 12/12/19 1659      OT LONG TERM GOAL #1   Title  Pt R hand digits flexion increase for pt to touch palm to grip cylinder objecs like utencils, pot handles, steering wheel , utencils    Baseline  Mc's 65 to 80 ; PIP's 80 and 85 at 3rd and 4th    Time  6    Period  Weeks    Status  New    Target Date  01/23/20      OT LONG TERM GOAL #2   Title  R grip strength increase with more than 5 lbs to hold pot , turn doorknob and carry more than 8 lbs without inrease symptoms    Baseline  grip R dominant hand 19 ,  L 30 lbs    Time  6    Period  Weeks    Status  New    Target Date  01/23/20      OT LONG TERM GOAL #3   Title  Pt to verbalize and show 3 joint protection and AE to increase ease of use of R hand and decrease pain at 4th digit    Baseline  Pt tender 4th A1pulley -and had before stiffness few month ago some lockingat 4th A1pulley - no knowledge on joint protection    Time  6    Period  Weeks    Status  New    Target Date  01/23/20            Plan - 01/03/20 [id number]    Clinical Impression Statement  Pt made great progress in AROM for R hand - but triggering of 4th and 3rd was limiting  end range - pt had shots in 3rd and 4th A1pulleys - by Dr Candelaria Stagers on 12/30 - and report less triggering - but still happening - less tenderness over 3rd A1pulley but still increase at 4th - add MC block splint for use to avoid tight grips - and to maintain her progress with digits ROM - by doing PROM composite flexion    OT Occupational Profile and History  Problem Focused Assessment - Including review of records relating to presenting problem    Occupational performance deficits (Please refer to evaluation for details):  ADL's;IADL's;Play;Leisure;Social Participation    Body Structure / Function / Physical Skills  ADL;Flexibility;ROM;UE functional  use;Decreased knowledge of use of DME;FMC;Dexterity;Pain;Strength    Rehab Potential  Good    Clinical Decision Making  Limited treatment options, no task modification necessary    Comorbidities Affecting Occupational Performance:  May have comorbidities impacting occupational performance    Modification or Assistance to Complete Evaluation   No modification of tasks or assist necessary to complete eval    OT Frequency  --   1-2 x wk   OT Duration  4 weeks    OT Treatment/Interventions  Self-care/ADL training;Iontophoresis;Therapeutic exercise;Patient/family education;Splinting;Paraffin;Manual Therapy;Passive range of motion;DME and/or AE instruction    Plan  progress and ionto if needed - use of MC block splint    OT Home Exercise Plan  see pt instruction    Consulted and Agree with Plan of Care  Patient       Patient will benefit from skilled therapeutic intervention in order to improve the following deficits and impairments:   Body Structure / Function / Physical Skills: ADL, Flexibility, ROM, UE functional use, Decreased knowledge of use of DME, FMC, Dexterity, Pain, Strength       Visit Diagnosis: Stiffness of right hand, not elsewhere classified - Plan: Ot plan of care cert/re-cert  Pain in right hand - Plan: Ot plan of care cert/re-cert  Muscle weakness (generalized) - Plan: Ot plan of care cert/re-cert    Problem List Patient Active Problem List   Diagnosis Date Noted  . Acute cholecystitis 11/18/2016  . Cholecystitis   . Pseudophakia of left eye 05/19/2016  . Pseudophakia of right eye 04/28/2016  . Osteopenia 07/15/2014  . 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, [place], [address] Phone: [phone]   Fax:  [phone]  Name: LLANA ZHOU MRN: [id number] Date of Birth: [birth year]

## 2020-01-03 NOTE — Patient Instructions (Signed)
Avoid tight and composite flexion of R hand  Contrast at home 3 x day  Composite PROM for flexion - pain free  MC flexion then intrinsic fist 8 reps  Wear MC block splint during composite flexion act to avoid tight and full fist

## 2020-01-09 ENCOUNTER — Ambulatory Visit: Payer: Medicare HMO | Admitting: Occupational Therapy

## 2020-01-09 ENCOUNTER — Other Ambulatory Visit: Payer: Self-pay

## 2020-01-09 DIAGNOSIS — M79641 Pain in right hand: Secondary | ICD-10-CM

## 2020-01-09 DIAGNOSIS — M25641 Stiffness of right hand, not elsewhere classified: Secondary | ICD-10-CM | POA: Diagnosis not present

## 2020-01-09 DIAGNOSIS — M6281 Muscle weakness (generalized): Secondary | ICD-10-CM

## 2020-01-09 NOTE — Patient Instructions (Signed)
Same HEP but during contrast - do intrinsic stretch in heat

## 2020-01-09 NOTE — Therapy (Signed)
[place] [facility] [address], [place], [address] Phone: [phone]   Fax:  [phone]  Occupational Therapy Treatment  Patient Details  Name: Lori Thornton MRN: [id number] Date of Birth: 01/02/1946 Referring Provider (OT): Dr Meda Coffee   Encounter Date: 01/09/2020  OT End of Session - 01/09/20 1400    Visit Number  5    Number of Visits  10    Date for OT Re-Evaluation  01/23/20    OT Start Time  1345    OT Stop Time  1443    OT Time Calculation (min)  58 min    Activity Tolerance  Patient tolerated treatment well    Behavior During Therapy  [facility] for tasks assessed/performed       Past Medical History:  Diagnosis Date  . Cancer [facility]) 2009   ovarian    Past Surgical History:  Procedure Laterality Date  . ABDOMINAL HYSTERECTOMY    . BREAST BIOPSY Right 2014   stereo bx. negative  . BREAST EXCISIONAL BIOPSY Right 1974 and 1985   benign  . CHOLECYSTECTOMY N/A 11/19/2016   Procedure: LAPAROSCOPIC CHOLECYSTECTOMY WITH INTRAOPERATIVE CHOLANGIOGRAM;  Surgeon: Florene Glen, MD;  Location: [facility] ORS;  Service: General;  Laterality: N/A;    There were no vitals filed for this visit.  Subjective Assessment - 01/09/20 1346    Subjective   Not as tender but still trigger about 3-4x day depending on what I do - sometimes I don't even notice it - gripping things better - splint rubbing little    Pertinent History  Pt has history of severe R thumb and fingers - with stiffness - pt report being able to touch palm about 6 months ago - but notice the last month cannot make fist and getting worse - stiffness more than pain - had some clicking in 4th digit    Patient Stated Goals  Want to be able to make fist -to grip objects, hold onto things, stir food, hold pots , turn doorknob - cut food better    Currently in Pain?  Yes    Pain Score  1     Pain Location  Hand    Pain Orientation  Left    Pain Descriptors /  Indicators  Tender    Pain Type  Chronic pain    Pain Onset  More than a month ago    Aggravating Factors   decrease to 1 over 4t A1pulley         [facility] OT Assessment - 01/09/20 0001      Strength   Right Hand Grip (lbs)  29    Right Hand Lateral Pinch  13 lbs    Right Hand 3 Point Pinch  10 lbs    Left Hand Grip (lbs)  38    Left Hand Lateral Pinch  11 lbs    Left Hand 3 Point Pinch  10 lbs      Right Hand AROM   R Index  MCP 0-90  80 Degrees    R Index PIP 0-100  100 Degrees    R Long  MCP 0-90  80 Degrees    R Long PIP 0-100  100 Degrees    R Ring  MCP 0-90  80 Degrees    R Ring PIP 0-100  90 Degrees    R Little  MCP 0-90  90 Degrees    R Little PIP 0-100  100 Degrees  grip and prehension increase greatly - no pain  See flowsheet  AROM improved greatly - do show some swanneck at 4th and 3rd -    Still tender over A1 pulley of 4th - but only 1/10        skin check done prior - no issues - pt to keep medication patch for hour on afterwards OT Treatments/Exercises (OP) - 01/09/20 0001      Iontophoresis   Type of Iontophoresis  Dexamethasone    Location  R 4th A1pulley     Dose  small patch     Time  19      RUE Contrast Bath   Time  9 minutes    Comments  R hand prior to soft tissue - done intrinsic stretch during heat        soft tissue mobs done to [facility] spreads and CT spreads -soft tissue done on volar digits graston tool nr 2 brushing  And gentle traction on digits -without pain and lateral bands massage  Prior to AROM   Gentle stretch and AAROM for Blocked intrinsic fist  AAROM for MC flexion   And then AAROM composite  Flexion - pt to do at home - not composite AROM  - pain free - pt initiate AROM fisting out of MC's - need to focus on intrinsic fist     Cont with opposition to all digits  And joint protection and AE   MC block splint  To cont to wear  on 4th digit during any composite flexion activities at home         OT  Education - 01/09/20 1359    Education Details  AAROM for intrinsic fist and , MP block splint wearing    Person(s) Educated  Patient    Methods  Explanation;Demonstration;Tactile cues;Verbal cues;Handout    Comprehension  Verbal cues required;Returned demonstration;Verbalized understanding       OT Short Term Goals - 12/12/19 1657      OT SHORT TERM GOAL #1   Title  Pt to show progres with HEP to increase AROM in R hand digits without increase symptoms    Baseline  no knowledge of HEP to increase AROM    Time  3    Period  Weeks    Status  New    Target Date  12/26/19        OT Long Term Goals - 12/12/19 1659      OT LONG TERM GOAL #1   Title  Pt R hand digits flexion increase for pt to touch palm to grip cylinder objecs like utencils, pot handles, steering wheel , utencils    Baseline  Mc's 65 to 80 ; PIP's 80 and 85 at 3rd and 4th    Time  6    Period  Weeks    Status  New    Target Date  01/23/20      OT LONG TERM GOAL #2   Title  R grip strength increase with more than 5 lbs to hold pot , turn doorknob and carry more than 8 lbs without inrease symptoms    Baseline  grip R dominant hand 19 ,  L 30 lbs    Time  6    Period  Weeks    Status  New    Target Date  01/23/20      OT LONG TERM GOAL #3   Title  Pt to verbalize and show 3 joint protection and AE  to increase ease of use of R hand and decrease pain at 4th digit    Baseline  Pt tender 4th A1pulley -and had before stiffness few month ago some lockingat 4th A1pulley - no knowledge on joint protection    Time  6    Period  Weeks    Status  New    Target Date  01/23/20            Plan - 01/09/20 1401    Clinical Impression Statement  Pt made great progress from [facility] in AROM in R hand digits , tenderness over A1pulleys - and triggering - pt still 1/10 tender over 4th and triggering in session during composite fist - pt report 3-4 triggers during day and has MC block splint that she wear some during day with  composite grip - pt cont to use MC  flexion more than intrinsic to initiate composite fist - pt to do some AAROM and stretches for intrinsic fist    OT Occupational Profile and History  Problem Focused Assessment - Including review of records relating to presenting problem    Occupational performance deficits (Please refer to evaluation for details):  ADL's;IADL's;Play;Leisure;Social Participation    Body Structure / Function / Physical Skills  ADL;Flexibility;ROM;UE functional use;Decreased knowledge of use of DME;FMC;Dexterity;Pain;Strength    Rehab Potential  Good    Clinical Decision Making  Limited treatment options, no task modification necessary    Comorbidities Affecting Occupational Performance:  May have comorbidities impacting occupational performance    Modification or Assistance to Complete Evaluation   No modification of tasks or assist necessary to complete eval    OT Frequency  --   1-2 wks   OT Duration  4 weeks    OT Treatment/Interventions  Self-care/ADL training;Iontophoresis;Therapeutic exercise;Patient/family education;Splinting;Paraffin;Manual Therapy;Passive range of motion;DME and/or AE instruction    Plan  progress and ionto if needed - use of MC block splint    OT Home Exercise Plan  see pt instruction    Consulted and Agree with Plan of Care  Patient       Patient will benefit from skilled therapeutic intervention in order to improve the following deficits and impairments:   Body Structure / Function / Physical Skills: ADL, Flexibility, ROM, UE functional use, Decreased knowledge of use of DME, FMC, Dexterity, Pain, Strength       Visit Diagnosis: Stiffness of right hand, not elsewhere classified  Pain in right hand  Muscle weakness (generalized)    Problem List Patient Active Problem List   Diagnosis Date Noted  . Acute cholecystitis 11/18/2016  . Cholecystitis   . Pseudophakia of left eye 05/19/2016  . Pseudophakia of right eye 04/28/2016  .  Osteopenia 07/15/2014  . 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 [place], [address] Phone: [phone]   Fax:  [phone]  Name: Lori Thornton MRN: [id number] Date of Birth: 08/13/1946

## 2020-01-12 ENCOUNTER — Other Ambulatory Visit: Payer: Self-pay

## 2020-01-12 ENCOUNTER — Ambulatory Visit: Payer: Medicare HMO | Admitting: Occupational Therapy

## 2020-01-12 DIAGNOSIS — M79641 Pain in right hand: Secondary | ICD-10-CM

## 2020-01-12 DIAGNOSIS — M25641 Stiffness of right hand, not elsewhere classified: Secondary | ICD-10-CM | POA: Diagnosis not present

## 2020-01-12 DIAGNOSIS — M6281 Muscle weakness (generalized): Secondary | ICD-10-CM

## 2020-01-12 NOTE — Therapy (Signed)
[place] [facility] [address], [place], [address] Phone: [phone]   Fax:  [phone]  Occupational Therapy Treatment  Patient Details  Name: Lori Thornton MRN: [id number] Date of Birth: 18-Sep-1946 Referring Provider (OT): Dr Meda Coffee   Encounter Date: 01/12/2020  OT End of Session - 01/12/20 0812    Visit Number  6    Number of Visits  10    Date for OT Re-Evaluation  01/23/20    OT Start Time  0800    OT Stop Time  0845    OT Time Calculation (min)  45 min    Activity Tolerance  Patient tolerated treatment well    Behavior During Therapy  [facility] for tasks assessed/performed       Past Medical History:  Diagnosis Date  . Cancer [facility]) 2009   ovarian    Past Surgical History:  Procedure Laterality Date  . ABDOMINAL HYSTERECTOMY    . BREAST BIOPSY Right 2014   stereo bx. negative  . BREAST EXCISIONAL BIOPSY Right 1974 and 1985   benign  . CHOLECYSTECTOMY N/A 11/19/2016   Procedure: LAPAROSCOPIC CHOLECYSTECTOMY WITH INTRAOPERATIVE CHOLANGIOGRAM;  Surgeon: Florene Glen, MD;  Location: [facility] ORS;  Service: General;  Laterality: N/A;    There were no vitals filed for this visit.  Subjective Assessment - 01/12/20 0808    Subjective   Finger little better- did not lock as much - but still little - use ring splint with gripping activities - and tenderness better    Pertinent History  Pt has history of severe R thumb and fingers - with stiffness - pt report being able to touch palm about 6 months ago - but notice the last month cannot make fist and getting worse - stiffness more than pain - had some clicking in 4th digit    Patient Stated Goals  Want to be able to make fist -to grip objects, hold onto things, stir food, hold pots , turn doorknob - cut food better    Currently in Pain?  Yes    Pain Score  1     Pain Location  Hand    Pain Orientation  Left    Pain Descriptors / Indicators  Tender    Pain  Type  Chronic pain    Pain Onset  More than a month ago         [facility] OT Assessment - 01/12/20 0001      Right Hand AROM   R Index  MCP 0-90  80 Degrees    R Index PIP 0-100  100 Degrees    R Long  MCP 0-90  85 Degrees    R Long PIP 0-100  100 Degrees    R Ring  MCP 0-90  85 Degrees    R Ring PIP 0-100  90 Degrees    R Little PIP 0-100  100 Degrees       pt show some more improvement in MC flexion see flowsheet  still triggering on 4th and on 2nd now to  But no tenderness or triggering on 3rd  And tenderness over A1pulley on 4th only 1/10 Pt to cont to use MC block splint with composite fist       skin check done prior - no issues - pt to keep medication patch for hour on afterwards   OT Treatments/Exercises (OP) - 01/12/20 0001      Iontophoresis   Type of Iontophoresis  Dexamethasone    Location  R 4th A1pulley     Dose  small patch     Time  19      RUE Contrast Bath   Time  9 minutes    Comments  R hand prior to soft tissue and ROM        soft tissue mobs done to [facility] spreads and CT spreads -soft tissue done on volar digits graston tool nr 2 brushing  And gentle traction on digits -without pain and lateral bands massage  Prior to AROM   Gentle stretch and AAROM for Blocked intrinsic fist  AAROM for MC flexion   And thenAAROMcompositeFlexion - pt to do at home - not composite AROM- pain free - pt initiate AROM fisting out of MC's - need to focus on intrinsic fist  VERY IMPORTANT    Cont with opposition to all digits  And joint protection and AE   MC block splint  To cont to wear  on 4th digit during any composite flexion activities at home         OT Education - 01/12/20 0812    Education Details  AAROM for intrinsic fist and , MP block splint wearing    Person(s) Educated  Patient    Methods  Explanation;Demonstration;Tactile cues;Verbal cues;Handout    Comprehension  Verbal cues required;Returned demonstration;Verbalized  understanding       OT Short Term Goals - 12/12/19 1657      OT SHORT TERM GOAL #1   Title  Pt to show progres with HEP to increase AROM in R hand digits without increase symptoms    Baseline  no knowledge of HEP to increase AROM    Time  3    Period  Weeks    Status  New    Target Date  12/26/19        OT Long Term Goals - 12/12/19 1659      OT LONG TERM GOAL #1   Title  Pt R hand digits flexion increase for pt to touch palm to grip cylinder objecs like utencils, pot handles, steering wheel , utencils    Baseline  Mc's 65 to 80 ; PIP's 80 and 85 at 3rd and 4th    Time  6    Period  Weeks    Status  New    Target Date  01/23/20      OT LONG TERM GOAL #2   Title  R grip strength increase with more than 5 lbs to hold pot , turn doorknob and carry more than 8 lbs without inrease symptoms    Baseline  grip R dominant hand 19 ,  L 30 lbs    Time  6    Period  Weeks    Status  New    Target Date  01/23/20      OT LONG TERM GOAL #3   Title  Pt to verbalize and show 3 joint protection and AE to increase ease of use of R hand and decrease pain at 4th digit    Baseline  Pt tender 4th A1pulley -and had before stiffness few month ago some lockingat 4th A1pulley - no knowledge on joint protection    Time  6    Period  Weeks    Status  New    Target Date  01/23/20            Plan - 01/12/20 0813    Clinical Impression Statement  Pt made  great progress from [facility] in AROM in R hand digits -but with long period of stiffness she had - she over power with MC flexion during composite - causing trigger fingers -pt to focus on intrinsic fist - and avoid tight and sustained grip - tenderness better- but still triggering - cont to improve with flexion - but triggering still present - tenderness better at 4th to 1/10 and no triggering at 3rd - but 2nd started triggering per pt    OT Occupational Profile and History  Problem Focused Assessment - Including review of records relating to  presenting problem    Occupational performance deficits (Please refer to evaluation for details):  ADL's;IADL's;Play;Leisure;Social Participation    Body Structure / Function / Physical Skills  ADL;Flexibility;ROM;UE functional use;Decreased knowledge of use of DME;FMC;Dexterity;Pain;Strength    Rehab Potential  Good    Clinical Decision Making  Limited treatment options, no task modification necessary    Comorbidities Affecting Occupational Performance:  May have comorbidities impacting occupational performance    Modification or Assistance to Complete Evaluation   No modification of tasks or assist necessary to complete eval    OT Frequency  1x / week    OT Duration  2 weeks   3 weeks   OT Treatment/Interventions  Self-care/ADL training;Iontophoresis;Therapeutic exercise;Patient/family education;Splinting;Paraffin;Manual Therapy;Passive range of motion;DME and/or AE instruction    Plan  progress and ionto if needed - use of MC block splint    OT Home Exercise Plan  see pt instruction    Consulted and Agree with Plan of Care  Patient       Patient will benefit from skilled therapeutic intervention in order to improve the following deficits and impairments:   Body Structure / Function / Physical Skills: ADL, Flexibility, ROM, UE functional use, Decreased knowledge of use of DME, FMC, Dexterity, Pain, Strength       Visit Diagnosis: Muscle weakness (generalized)  Pain in right hand  Stiffness of right hand, not elsewhere classified    Problem List Patient Active Problem List   Diagnosis Date Noted  . Acute cholecystitis 11/18/2016  . Cholecystitis   . Pseudophakia of left eye 05/19/2016  . Pseudophakia of right eye 04/28/2016  . Osteopenia 07/15/2014  . 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  [place], [address] Phone: [phone]   Fax:  [phone]  Name: Lori Thornton MRN: [id number] Date of Birth: 1946-04-11

## 2020-01-12 NOTE — Patient Instructions (Signed)
Avoid tight and sustained grip - use MC block splint And AROM block - focus on PIP /intrinsic fist

## 2020-01-19 ENCOUNTER — Ambulatory Visit: Payer: Medicare HMO | Admitting: Occupational Therapy

## 2020-01-19 ENCOUNTER — Other Ambulatory Visit: Payer: Self-pay

## 2020-01-19 DIAGNOSIS — M25641 Stiffness of right hand, not elsewhere classified: Secondary | ICD-10-CM

## 2020-01-19 DIAGNOSIS — M6281 Muscle weakness (generalized): Secondary | ICD-10-CM

## 2020-01-19 DIAGNOSIS — M79641 Pain in right hand: Secondary | ICD-10-CM

## 2020-01-19 NOTE — Therapy (Signed)
[place] [facility] [address] [place], [address] Phone: [phone]   Fax:  [phone]  Occupational Therapy Treatment  Patient Details  Name: Lori Thornton MRN: [id number] Date of Birth: February 10, 1946 Referring Provider (OT): Dr Meda Coffee   Encounter Date: 01/19/2020  OT End of Session - 01/19/20 0916    Visit Number  7    Number of Visits  10    Date for OT Re-Evaluation  01/23/20    OT Start Time  0900    OT Stop Time  0950    OT Time Calculation (min)  50 min    Activity Tolerance  Patient tolerated treatment well    Behavior During Therapy  [facility] for tasks assessed/performed       Past Medical History:  Diagnosis Date  . Cancer [facility]) 2009   ovarian    Past Surgical History:  Procedure Laterality Date  . ABDOMINAL HYSTERECTOMY    . BREAST BIOPSY Right 2014   stereo bx. negative  . BREAST EXCISIONAL BIOPSY Right 1974 and 1985   benign  . CHOLECYSTECTOMY N/A 11/19/2016   Procedure: LAPAROSCOPIC CHOLECYSTECTOMY WITH INTRAOPERATIVE CHOLANGIOGRAM;  Surgeon: Florene Glen, MD;  Location: [facility] ORS;  Service: General;  Laterality: N/A;    There were no vitals filed for this visit.  Subjective Assessment - 01/19/20 0914    Subjective   No pain , just stiffness- locking maybe about 2 x day on the ring finger - but the index locking some and middle on the L hand - little tender only    Pertinent History  Pt has history of severe R thumb and fingers - with stiffness - pt report being able to touch palm about 6 months ago - but notice the last month cannot make fist and getting worse - stiffness more than pain - had some clicking in 4th digit    Patient Stated Goals  Want to be able to make fist -to grip objects, hold onto things, stir food, hold pots , turn doorknob - cut food better    Currently in Pain?  No/denies         [facility] OT Assessment - 01/19/20 0001      Strength   Right Hand Grip (lbs)  31    Right Hand Lateral Pinch  13 lbs    Right Hand 3 Point Pinch  10 lbs    Left Hand Grip (lbs)  38    Left Hand Lateral Pinch  10 lbs    Left Hand 3 Point Pinch  10 lbs      Right Hand AROM   R Index  MCP 0-90  80 Degrees    R Index PIP 0-100  100 Degrees    R Long  MCP 0-90  85 Degrees    R Long PIP 0-100  100 Degrees    R Ring  MCP 0-90  90 Degrees    R Ring PIP 0-100  95 Degrees    R Little  MCP 0-90  90 Degrees    R Little PIP 0-100  100 Degrees      Pt AROm cont to increase - made great progress from [facility]  Pt tenderness over A1 pulley of 4th and 2nd on R hand 1/10  Pt report some triggering starting on L 3rd   reinforce for pt to contact DR  Candelaria Stagers - for possible shot  Not to wait like with R hand last year that  she lost motion             OT Treatments/Exercises (OP) - 01/19/20 0001      Iontophoresis   Type of Iontophoresis  Dexamethasone    Location  R 4th A1pulley     Dose  small patch     Time  19      RUE Contrast Bath   Time  9 minutes    Comments  R hand prior to soft tissue       after contrast -  soft tissue mobs done to [facility] spreads and CT spreads -soft tissue done on volar digits graston tool nr 2 brushing  And gentle traction on digits -without pain and lateral bands massage  Prior to AROM   And tendon glides to cont with - focus on intrinsic fist  No over power with MC flexion  Avoid triggering - use MC block splint as needed  And use larger joints and built up handles - avoid tight and sustained grip        OT Education - 01/19/20 0916    Education Details  cont tendon glides , and joint protection    Person(s) Educated  Patient    Methods  Explanation;Demonstration;Tactile cues;Verbal cues;Handout    Comprehension  Verbal cues required;Returned demonstration;Verbalized understanding       OT Short Term Goals - 12/12/19 1657      OT SHORT TERM GOAL #1   Title  Pt to show progres with HEP to increase AROM in R hand digits  without increase symptoms    Baseline  no knowledge of HEP to increase AROM    Time  3    Period  Weeks    Status  New    Target Date  12/26/19        OT Long Term Goals - 12/12/19 1659      OT LONG TERM GOAL #1   Title  Pt R hand digits flexion increase for pt to touch palm to grip cylinder objecs like utencils, pot handles, steering wheel , utencils    Baseline  Mc's 65 to 80 ; PIP's 80 and 85 at 3rd and 4th    Time  6    Period  Weeks    Status  New    Target Date  01/23/20      OT LONG TERM GOAL #2   Title  R grip strength increase with more than 5 lbs to hold pot , turn doorknob and carry more than 8 lbs without inrease symptoms    Baseline  grip R dominant hand 19 ,  L 30 lbs    Time  6    Period  Weeks    Status  New    Target Date  01/23/20      OT LONG TERM GOAL #3   Title  Pt to verbalize and show 3 joint protection and AE to increase ease of use of R hand and decrease pain at 4th digit    Baseline  Pt tender 4th A1pulley -and had before stiffness few month ago some lockingat 4th A1pulley - no knowledge on joint protection    Time  6    Period  Weeks    Status  New    Target Date  01/23/20            Plan - 01/19/20 0917    Clinical Impression Statement  Pt R hand triggering decrease and tenderness over A1pulley - increase flexion  in digits this date - pt to see Dr Candelaria Stagers - report she has some triggering in L hand and R 2nd - reinforce to get help earlier this time and not wait until she started loosing motion like with her R hand last year- still avoid gripping ball or putty    OT Occupational Profile and History  Problem Focused Assessment - Including review of records relating to presenting problem    Occupational performance deficits (Please refer to evaluation for details):  ADL's;IADL's;Play;Leisure;Social Participation    Body Structure / Function / Physical Skills  ADL;Flexibility;ROM;UE functional use;Decreased knowledge of use of  DME;FMC;Dexterity;Pain;Strength    Rehab Potential  Good    Clinical Decision Making  Limited treatment options, no task modification necessary    Comorbidities Affecting Occupational Performance:  May have comorbidities impacting occupational performance    Modification or Assistance to Complete Evaluation   No modification of tasks or assist necessary to complete eval    OT Frequency  --   3wks-4 wks   OT Duration  --   3-4 wks   OT Treatment/Interventions  Self-care/ADL training;Iontophoresis;Therapeutic exercise;Patient/family education;Splinting;Paraffin;Manual Therapy;Passive range of motion;DME and/or AE instruction    Plan  call me or follow up in 3-4 wks    OT Home Exercise Plan  see pt instruction    Consulted and Agree with Plan of Care  Patient       Patient will benefit from skilled therapeutic intervention in order to improve the following deficits and impairments:   Body Structure / Function / Physical Skills: ADL, Flexibility, ROM, UE functional use, Decreased knowledge of use of DME, FMC, Dexterity, Pain, Strength       Visit Diagnosis: Stiffness of right hand, not elsewhere classified  Muscle weakness (generalized)  Pain in right hand    Problem List Patient Active Problem List   Diagnosis Date Noted  . Acute cholecystitis 11/18/2016  . Cholecystitis   . Pseudophakia of left eye 05/19/2016  . Pseudophakia of right eye 04/28/2016  . Osteopenia 07/15/2014  . 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 [place], [address] Phone: [phone]   Fax:  [phone]  Name: SHINEKA WANDS MRN: [id number] Date of Birth: January 08, 1946

## 2020-01-19 NOTE — Patient Instructions (Signed)
Cont with HEP - contrast while triggering -and am and pm Tendon glides Joint protection - using larger joints, and built up handles ( avoid tight and sustained grip) Call Dr Candelaria Stagers for check up and possible shots for other fingers that starting to trigger

## 2020-02-07 ENCOUNTER — Other Ambulatory Visit: Payer: Self-pay

## 2020-02-07 ENCOUNTER — Ambulatory Visit: Payer: Medicare HMO | Attending: Internal Medicine | Admitting: Occupational Therapy

## 2020-02-07 DIAGNOSIS — M25641 Stiffness of right hand, not elsewhere classified: Secondary | ICD-10-CM | POA: Insufficient documentation

## 2020-02-07 DIAGNOSIS — M6281 Muscle weakness (generalized): Secondary | ICD-10-CM | POA: Insufficient documentation

## 2020-02-07 DIAGNOSIS — M79641 Pain in right hand: Secondary | ICD-10-CM | POA: Diagnosis present

## 2020-02-07 NOTE — Patient Instructions (Signed)
See note

## 2020-02-07 NOTE — Therapy (Signed)
[facility] [address], [place], [address] Phone: [phone]   Fax:  [phone]  Occupational Therapy Treatment/discharge  Patient Details  Name: Lori Thornton MRN: [id number] Date of Birth: 1946/05/31 Referring Provider (OT): Dr Meda Coffee   Encounter Date: 02/07/2020  OT End of Session - 02/07/20 1138    Visit Number  8    Number of Visits  8    Date for OT Re-Evaluation  02/07/20    OT Start Time  1105    OT Stop Time  1130    OT Time Calculation (min)  25 min    Activity Tolerance  Patient tolerated treatment well    Behavior During Therapy  [facility] for tasks assessed/performed       Past Medical History:  Diagnosis Date  . Cancer [facility]) 2009   ovarian    Past Surgical History:  Procedure Laterality Date  . ABDOMINAL HYSTERECTOMY    . BREAST BIOPSY Right 2014   stereo bx. negative  . BREAST EXCISIONAL BIOPSY Right 1974 and 1985   benign  . CHOLECYSTECTOMY N/A 11/19/2016   Procedure: LAPAROSCOPIC CHOLECYSTECTOMY WITH INTRAOPERATIVE CHOLANGIOGRAM;  Surgeon: Florene Glen, MD;  Location: [facility] ORS;  Service: General;  Laterality: N/A;    There were no vitals filed for this visit.  Subjective Assessment - 02/07/20 1137    Subjective   I seen Dr Candelaria Stagers last Tues for 2 shots - to my R index and L middle finger - the L one feels good but the index on R still clicking - did feel some clicking this am with my exercises    Pertinent History  Pt has history of severe R thumb and fingers - with stiffness - pt report being able to touch palm about 6 months ago - but notice the last month cannot make fist and getting worse - stiffness more than pain - had some clicking in 4th digit    Patient Stated Goals  Want to be able to make fist -to grip objects, hold onto things, stir food, hold pots , turn doorknob - cut food better    Currently in Pain?  No/denies         [facility] OT Assessment - 02/07/20 0001       Strength   Right Hand Grip (lbs)  35    Right Hand Lateral Pinch  13 lbs    Right Hand 3 Point Pinch  11 lbs    Left Hand Grip (lbs)  38    Left Hand Lateral Pinch  10 lbs    Left Hand 3 Point Pinch  11 lbs      Right Hand AROM   R Index  MCP 0-90  80 Degrees    R Index PIP 0-100  100 Degrees    R Long  MCP 0-90  8 Degrees    R Long PIP 0-100  100 Degrees    R Ring  MCP 0-90  90 Degrees    R Ring PIP 0-100  100 Degrees    R Little  MCP 0-90  90 Degrees    R Little PIP 0-100  100 Degrees      Left Hand AROM   L Index  MCP 0-90  75 Degrees    L Index PIP 0-100  100 Degrees    L Long  MCP 0-90  85 Degrees    L Long PIP 0-100  100 Degrees    L  Ring  MCP 0-90  90 Degrees    L Ring PIP 0-100  100 Degrees    L Little  MCP 0-90  90 Degrees    L Little PIP 0-100  100 Degrees        assess AROM in bilateral hands - great progress from [facility] -see flowsheet And grip and prehension increase greatly too - see flowsheet  Pt not tender over A1pulley  Still do report some clicking in R 4th -but pt has [place] -and could be because of going from hyper extention at PIP to flexion -  And then 1/10 tender in R 2nd - did had shot  Pt can use MC block splint for composite fist act - that causing her trigger finger sometimes -  And then paraffin bath in am and pm - review with pt MC flexion  And blocked intrinsic fist  With composite fist - but stay away from tight and sustained grip in ADl's and IADl's                   OT Education - 02/07/20 1138    Education Details  discharge instructions    Person(s) Educated  Patient    Methods  Explanation;Demonstration;Tactile cues;Verbal cues;Handout    Comprehension  Verbal cues required;Returned demonstration;Verbalized understanding       OT Short Term Goals - 02/07/20 1148      OT SHORT TERM GOAL #1   Title  Pt to show progres with HEP to increase AROM in R hand digits without increase symptoms    Status  Achieved         OT Long Term Goals - 02/07/20 1148      OT LONG TERM GOAL #1   Title  Pt R hand digits flexion increase for pt to touch palm to grip cylinder objecs like utencils, pot handles, steering wheel , utencils    Status  Achieved      OT LONG TERM GOAL #2   Title  R grip strength increase with more than 5 lbs to hold pot , turn doorknob and carry more than 8 lbs without inrease symptoms    Status  Achieved      OT LONG TERM GOAL #3   Title  Pt to verbalize and show 3 joint protection and AE to increase ease of use of R hand and decrease pain at 4th digit    Status  Achieved            Plan - 02/07/20 1139    Clinical Impression Statement  Pt made great progress from [facility] in AROM in bilateral hands and increase grip and prehension strenght - pt do have some issues still with trigger fingers - because of her arthritis - but pt this date show great AROM , and reinforce with pt to use paraffin bath in am and pm prior to tendon glides to decrease stiffness , and to maintain intrinsic fist - that she do not have to over use the MC flexion during composite - that causes trigger finger - pt discharge with use of paraffin bath , tendon glides - and joint protection principles    OT Occupational Profile and History  Problem Focused Assessment - Including review of records relating to presenting problem    Occupational performance deficits (Please refer to evaluation for details):  ADL's;IADL's;Play;Leisure;Social Participation    Body Structure / Function / Physical Skills  ADL;Flexibility;ROM;UE functional use;Decreased knowledge of use of DME;FMC;Dexterity;Pain;Strength  Rehab Potential  Good    Clinical Decision Making  Limited treatment options, no task modification necessary    Comorbidities Affecting Occupational Performance:  May have comorbidities impacting occupational performance    Modification or Assistance to Complete Evaluation   No modification of tasks or assist necessary to  complete eval    OT Treatment/Interventions  Self-care/ADL training;Iontophoresis;Therapeutic exercise;Patient/family education;Splinting;Paraffin;Manual Therapy;Passive range of motion;DME and/or AE instruction    Plan  discharge instruction    OT Home Exercise Plan  see pt instruction    Consulted and Agree with Plan of Care  Patient       Patient will benefit from skilled therapeutic intervention in order to improve the following deficits and impairments:   Body Structure / Function / Physical Skills: ADL, Flexibility, ROM, UE functional use, Decreased knowledge of use of DME, FMC, Dexterity, Pain, Strength       Visit Diagnosis: Stiffness of right hand, not elsewhere classified - Plan: Ot plan of care cert/re-cert  Muscle weakness (generalized) - Plan: Ot plan of care cert/re-cert  Pain in right hand - Plan: Ot plan of care cert/re-cert    Problem List Patient Active Problem List   Diagnosis Date Noted  . Acute cholecystitis 11/18/2016  . Cholecystitis   . Pseudophakia of left eye 05/19/2016  . Pseudophakia of right eye 04/28/2016  . Osteopenia 07/15/2014  . 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 [place], [address] Phone: [phone]   Fax:  [phone]  Name: SRINITHI ANZALDUA MRN: [id number] Date of Birth: [birth year]

## 2020-08-20 ENCOUNTER — Other Ambulatory Visit: Payer: Self-pay | Admitting: Internal Medicine

## 2020-08-20 DIAGNOSIS — Z1231 Encounter for screening mammogram for malignant neoplasm of breast: Secondary | ICD-10-CM

## 2020-09-19 ENCOUNTER — Ambulatory Visit
Admission: RE | Admit: 2020-09-19 | Discharge: 2020-09-19 | Disposition: A | Discharge status: Home / Self Care | Payer: Medicare HMO | Source: Ambulatory Visit | Attending: Internal Medicine | Admitting: Internal Medicine

## 2020-09-19 ENCOUNTER — Other Ambulatory Visit: Payer: Self-pay

## 2020-09-19 DIAGNOSIS — Z1231 Encounter for screening mammogram for malignant neoplasm of breast: Secondary | ICD-10-CM | POA: Diagnosis present

## 2021-03-11 ENCOUNTER — Ambulatory Visit: Payer: Medicare HMO | Admitting: Dermatology

## 2021-03-12 ENCOUNTER — Ambulatory Visit: Payer: Medicare HMO | Admitting: Dermatology

## 2021-03-26 ENCOUNTER — Encounter: Payer: Self-pay | Admitting: Dermatology

## 2021-03-26 ENCOUNTER — Ambulatory Visit: Payer: Medicare HMO | Admitting: Dermatology

## 2021-03-26 ENCOUNTER — Other Ambulatory Visit: Payer: Self-pay

## 2021-03-26 DIAGNOSIS — B351 Tinea unguium: Secondary | ICD-10-CM

## 2021-03-26 DIAGNOSIS — L570 Actinic keratosis: Secondary | ICD-10-CM | POA: Diagnosis not present

## 2021-03-26 DIAGNOSIS — L821 Other seborrheic keratosis: Secondary | ICD-10-CM

## 2021-03-26 DIAGNOSIS — L578 Other skin changes due to chronic exposure to nonionizing radiation: Secondary | ICD-10-CM | POA: Diagnosis not present

## 2021-03-26 DIAGNOSIS — D485 Neoplasm of uncertain behavior of skin: Secondary | ICD-10-CM | POA: Diagnosis not present

## 2021-03-26 MED ORDER — CICLOPIROX OLAMINE 0.77 % EX CREA: TOPICAL_CREAM | CUTANEOUS | 5 refills | Status: DC

## 2021-03-26 NOTE — Progress Notes (Addendum)
New Patient Visit  Subjective  Lori Thornton is a 75 y.o. female who presents for the following: Nail Problem (Pt c/o toenail fungus on right foot. ) and Spot Check (Pt states that she has a rough spot on her face that she would like checked today).  Spot present at lip for 3-4 months Toenail changes since last year  Objective  Well appearing patient in no apparent distress; mood and affect are within normal limits.  A focused examination was performed including face, ears, neck, chest, toenails. Relevant physical exam findings are noted in the Assessment and Plan.  Objective  left upper cutaneous lip x 1, left nasal tip x 1 (2): Erythematous thin papules/macules with gritty scale.   Objective  Mid Chest Left: 0.5 cm pink papule       Objective  Mid Chest Right: 0.7 cm pink papule      Objective  Right Foot - Anterior: Toenails thickened with subungual debris  Assessment & Plan  AK (actinic keratosis) (2) left upper cutaneous lip x 1, left nasal tip x 1  Discussed treatment options being Ln2 vs F5u/Calcipotriene cream.   Pt decided on Ln2 today.   Prior to procedure, discussed risks of blister formation, small wound, skin dyspigmentation, or rare scar following cryotherapy.     Destruction of lesion - left upper cutaneous lip x 1, left nasal tip x 1  Destruction method: cryotherapy   Informed consent: discussed and consent obtained   Lesion destroyed using liquid nitrogen: Yes   Outcome: patient tolerated procedure well with no complications   Post-procedure details: wound care instructions given    Neoplasm of uncertain behavior of skin (2) Mid Chest Left  Skin / nail biopsy Type of biopsy: tangential   Informed consent: discussed and consent obtained   Timeout: patient name, date of birth, surgical site, and procedure verified   Procedure prep:  Patient was prepped and draped in usual sterile fashion Prep type:  Isopropyl alcohol Anesthesia:  the lesion was anesthetized in a standard fashion   Anesthetic:  1% lidocaine w/ epinephrine 1-100,000 buffered w/ 8.4% NaHCO3 Instrument used: flexible razor blade   Hemostasis achieved with: pressure, aluminum chloride and electrodesiccation   Outcome: patient tolerated procedure well   Post-procedure details: sterile dressing applied and wound care instructions given   Dressing type: bandage and petrolatum    Specimen 1 - Surgical pathology Differential Diagnosis: r/o BCC  Check Margins: Yes 0.5 cm pink papule  Mid Chest Right  Skin / nail biopsy Type of biopsy: tangential   Informed consent: discussed and consent obtained   Timeout: patient name, date of birth, surgical site, and procedure verified   Procedure prep:  Patient was prepped and draped in usual sterile fashion Prep type:  Isopropyl alcohol Anesthesia: the lesion was anesthetized in a standard fashion   Anesthetic:  1% lidocaine w/ epinephrine 1-100,000 buffered w/ 8.4% NaHCO3 Instrument used: flexible razor blade   Hemostasis achieved with: pressure, aluminum chloride and electrodesiccation   Outcome: patient tolerated procedure well   Post-procedure details: sterile dressing applied and wound care instructions given   Dressing type: bandage and petrolatum    Specimen 2 - Surgical pathology Differential Diagnosis: r/o BCC  Check Margins: Yes 0.7 cm pink papule  Tinea unguium Right Foot - Anterior  Discussed treatment options including prescription PO terbinafine vs topical therapy for nails risks and benefits. Discussed option of not treating nails for cure but instead using  antifungal cream on entire foot  once a week to help prevent recurrent tinea pedis. She defers PO therapy or onychomycosis directed therapy.  Start ciclopirox cream 0.77% once a week to prevent tinea pedis.  If expensive, can substitute otc terbinafine cream.   ciclopirox (LOPROX) 0.77 % cream - Right Foot - Anterior   Seborrheic  Keratoses - Stuck-on, waxy, tan-brown papules and/or plaques  - Benign-appearing - Discussed benign etiology and prognosis. - Observe - Call for any changes  Actinic Damage - chronic, secondary to cumulative UV radiation exposure/sun exposure over time - diffuse scaly erythematous macules with underlying dyspigmentation - Recommend daily broad spectrum sunscreen SPF 30+ to sun-exposed areas, reapply every 2 hours as needed.  - Recommend staying in the shade or wearing long sleeves, sun glasses (UVA+UVB protection) and wide brim hats (4-inch brim around the entire circumference of the hat). - Call for new or changing lesions.   Return in about 3 months (around 06/26/2021) for 1-3 months TBSE.   I, Harriett Sine, CMA, am acting as scribe for Forest Gleason, MD.  Documentation: I have reviewed the above documentation for accuracy and completeness, and I agree with the above.  Forest Gleason, MD

## 2021-03-26 NOTE — Patient Instructions (Addendum)
Cryotherapy Aftercare  . Wash gently with soap and water everyday.   Marland Kitchen Apply Vaseline and Band-Aid daily until healed.    Wound Care Instructions  1. Cleanse wound gently with soap and water once a day then pat dry with clean gauze. Apply a thing coat of Petrolatum (petroleum jelly, "Vaseline") over the wound (unless you have an allergy to this). We recommend that you use a new, sterile tube of Vaseline. Do not pick or remove scabs. Do not remove the yellow or white "healing tissue" from the base of the wound.  2. Cover the wound with fresh, clean, nonstick gauze and secure with paper tape. You may use Band-Aids in place of gauze and tape if the would is small enough, but would recommend trimming much of the tape off as there is often too much. Sometimes Band-Aids can irritate the skin.  3. You should call the office for your biopsy report after 1 week if you have not already been contacted.  4. If you experience any problems, such as abnormal amounts of bleeding, swelling, significant bruising, significant pain, or evidence of infection, please call the office immediately.  5. FOR ADULT SURGERY PATIENTS: If you need something for pain relief you may take 1 extra strength Tylenol (acetaminophen) AND 2 Ibuprofen (200mg  each) together every 4 hours as needed for pain. (do not take these if you are allergic to them or if you have a reason you should not take them.) Typically, you may only need pain medication for 1 to 3 days.         Melanoma ABCDEs  Melanoma is the most dangerous type of skin cancer, and is the leading cause of death from skin disease.  You are more likely to develop melanoma if you:  Have light-colored skin, light-colored eyes, or red or blond hair  Spend a lot of time in the sun  Tan regularly, either outdoors or in a tanning bed  Have had blistering sunburns, especially during childhood  Have a close family member who has had a melanoma  Have atypical moles or  large birthmarks  Early detection of melanoma is key since treatment is typically straightforward and cure rates are extremely high if we catch it early.   The first sign of melanoma is often a change in a mole or a new dark spot.  The ABCDE system is a way of remembering the signs of melanoma.  A for asymmetry:  The two halves do not match. B for border:  The edges of the growth are irregular. C for color:  A mixture of colors are present instead of an even brown color. D for diameter:  Melanomas are usually (but not always) greater than 79mm - the size of a pencil eraser. E for evolution:  The spot keeps changing in size, shape, and color.  Please check your skin once per month between visits. You can use a small mirror in front and a large mirror behind you to keep an eye on the back side or your body.   If you see any new or changing lesions before your next follow-up, please call to schedule a visit.  Please continue daily skin protection including broad spectrum sunscreen SPF 30+ to sun-exposed areas, reapplying every 2 hours as needed when you're outdoors.   Staying in the shade or wearing long sleeves, sun glasses (UVA+UVB protection) and wide brim hats (4-inch brim around the entire circumference of the hat) are also recommended for sun protection.

## 2021-04-30 ENCOUNTER — Other Ambulatory Visit: Payer: Self-pay

## 2021-04-30 ENCOUNTER — Ambulatory Visit: Payer: Medicare HMO | Admitting: Dermatology

## 2021-04-30 DIAGNOSIS — L570 Actinic keratosis: Secondary | ICD-10-CM

## 2021-04-30 DIAGNOSIS — D1800 Hemangioma unspecified site: Secondary | ICD-10-CM

## 2021-04-30 DIAGNOSIS — L813 Cafe au lait spots: Secondary | ICD-10-CM

## 2021-04-30 DIAGNOSIS — R21 Rash and other nonspecific skin eruption: Secondary | ICD-10-CM

## 2021-04-30 DIAGNOSIS — L814 Other melanin hyperpigmentation: Secondary | ICD-10-CM

## 2021-04-30 DIAGNOSIS — Z1283 Encounter for screening for malignant neoplasm of skin: Secondary | ICD-10-CM

## 2021-04-30 DIAGNOSIS — L578 Other skin changes due to chronic exposure to nonionizing radiation: Secondary | ICD-10-CM

## 2021-04-30 DIAGNOSIS — L821 Other seborrheic keratosis: Secondary | ICD-10-CM

## 2021-04-30 DIAGNOSIS — D229 Melanocytic nevi, unspecified: Secondary | ICD-10-CM

## 2021-04-30 NOTE — Progress Notes (Signed)
Follow-Up Visit   Subjective  Lori Thornton is a 75 y.o. female who presents for the following: FBSE (Patient here for full body skin exam and skin cancer screening. Patient with hx of AK's. She advises the areas treated in March are still a little rough feeling otherwise no new or changing spots. ).   The following portions of the chart were reviewed this encounter and updated as appropriate:   Tobacco  Allergies  Meds  Problems  Med Hx  Surg Hx  Fam Hx      Review of Systems:  No other skin or systemic complaints except as noted in HPI or Assessment and Plan.  Objective  Well appearing patient in no apparent distress; mood and affect are within normal limits.  A full examination was performed including scalp, head, eyes, ears, nose, lips, neck, chest, axillae, abdomen, back, buttocks, bilateral upper extremities, bilateral lower extremities, hands, feet, fingers, toes, fingernails, and toenails. All findings within normal limits unless otherwise noted below.  Objective  left nasal tip/supratip x 1, left upper cutaneous lip x 1, right 2nd finger x 1 (3): Erythematous thin papules/macules with gritty scale.   Objective  Mid Back: Multiple soft skin colored to pink papules  Soft skin colored plaque left hip   Assessment & Plan  AK (actinic keratosis) (3) left nasal tip/supratip x 1, left upper cutaneous lip x 1, right 2nd finger x 1  Prior to procedure, discussed risks of blister formation, small wound, skin dyspigmentation, or rare scar following cryotherapy.    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 Recommend referring to neurofibroma clinic for evaluation. Discussed that neurofibromatosis is a genetic condition which can be passed to children and which can come with an increased risk of tumors. Recommend evaluation to see if she would need any screening or monitoring but also so that if she has it, her children could have an opportunity to be evaluated for NF as well.   Lentigines - Scattered tan macules - Due to sun exposure - Benign-appering, observe - Recommend daily broad spectrum sunscreen SPF 30+ to sun-exposed areas, reapply every 2 hours as needed. - Call for any changes  Seborrheic Keratoses - Stuck-on, waxy, tan-brown papules and/or plaques  - Benign-appearing - Discussed benign etiology and prognosis. - Observe - Call for any changes  Melanocytic Nevi - Tan-brown and/or pink-flesh-colored symmetric macules and papules - Benign appearing on exam today - Observation - Call clinic for new or changing moles - Recommend daily use of broad spectrum spf 30+ sunscreen to sun-exposed areas.   Hemangiomas - Red papules - Discussed benign nature - Observe - Call for any changes  Actinic Damage - Chronic condition, secondary to cumulative UV/sun exposure - diffuse scaly erythematous macules with underlying dyspigmentation - Recommend daily broad spectrum sunscreen SPF 30+ to sun-exposed areas, reapply every 2 hours as needed.  - Staying in the shade or wearing long sleeves, sun glasses (UVA+UVB protection) and wide brim hats (4-inch brim around the entire circumference of the hat) are also  recommended for sun protection.  - Call for new or changing lesions.  Skin cancer screening performed today.  Cafe au Lait  - Tan patch at right abdomen, left mid back x 2, left upper buttock, left inf buttock x 2, left post thigh - Genetic - Benign, observe - Call for any  changes   Return in about 3 months (around 07/31/2021) for AK follow up, 1 year FBSE.  Graciella Belton, RMA, am acting as scribe for Forest Gleason, MD .  Documentation: I have reviewed the above documentation for accuracy and completeness, and I agree with the above.  Forest Gleason, MD

## 2021-04-30 NOTE — Patient Instructions (Addendum)
Cryotherapy Aftercare  . Wash gently with soap and water everyday.   Marland Kitchen Apply Vaseline and Band-Aid daily until healed.  Prior to procedure, discussed risks of blister formation, small wound, skin dyspigmentation, or rare scar following cryotherapy.   Continue ciclopirox cream 0.77% once a week to prevent tinea pedis.  If expensive, can substitute otc terbinafine cream.   Some Recommended Sunscreens Include:  Good for Daily Wear (feels like lotion but NOT sweat resistant) Cerave AM Moisturizer with SPF EltaMD UV Lotion  Body or All Over Sunscreen EltaMD UV active for body and face Blue lizard sensitive Sun bum mineral (avoid if sensitive to scent) Aveeno Positively Mineral Neutrogena sheer zinc (Slightly harder to rub in) CVS clear zinc (Slightly harder to rub in)  Clear Face Sunscreen EltaMD UV Elements CeraVe hydrating sunscreen 50 face  Tinted Face Sunscreen Alastin Hydratint (good for most skin tones, may be slightly dark if you are very fair) Colorescience Sunforgettable Total Protection Face Shield (good for most skin tones) EltaMD UV Physical La Roche Posay Mineral Tinted Cotz Flawless Complexion   Powder Sunscreen (Nice for reapplying or applying on the go) Colorescience Sunforgettable Total Protection Brush on Shield (available in different tints)  Face Sunscreen Available in Different Tints Colorescience Sunforgettable Total Protection Brush on Shield  bareMinerals Complexion Rescue Tinted Hydrating Gel Cream Broad Spectrum SPF 30 UnSun mineral tinted (comes in medium/dark and light/medium)  Kids (over 6 months) - Mineral Sunscreens Recommended eltaMD UV Pure MDsolarSciences KidStick 40 SPF Aveeno Baby Continuous Protection Sensitive Zinc Oxide Blue Lizard Kids mineral based sunscreen lotion Mustela Mineral Sunscreen for face and body Neutrogena Sheer Zinc Kids Sunscreen Stick  Tinted to look like a tan PCAskin sheer tint body spray  Melanoma  ABCDEs  Melanoma is the most dangerous type of skin cancer, and is the leading cause of death from skin disease.  You are more likely to develop melanoma if you:  Have light-colored skin, light-colored eyes, or red or blond hair  Spend a lot of time in the sun  Tan regularly, either outdoors or in a tanning bed  Have had blistering sunburns, especially during childhood  Have a close family member who has had a melanoma  Have atypical moles or large birthmarks  Early detection of melanoma is key since treatment is typically straightforward and cure rates are extremely high if we catch it early.   The first sign of melanoma is often a change in a mole or a new dark spot.  The ABCDE system is a way of remembering the signs of melanoma.  A for asymmetry:  The two halves do not match. B for border:  The edges of the growth are irregular. C for color:  A mixture of colors are present instead of an even brown color. D for diameter:  Melanomas are usually (but not always) greater than 57mm - the size of a pencil eraser. E for evolution:  The spot keeps changing in size, shape, and color.  Please check your skin once per month between visits. You can use a small mirror in front and a large mirror behind you to keep an eye on the back side or your body.   If you see any new or changing lesions before your next follow-up, please call to schedule a visit.  Please continue daily skin protection including broad spectrum sunscreen SPF 30+ to sun-exposed areas, reapplying every 2 hours as needed when you're outdoors.   If you have any questions or concerns for  your doctor, please call our main line at [phone] and press option 4 to reach your doctor's medical assistant. If no one answers, please leave a voicemail as directed and we will return your call as soon as possible. Messages left after 4 pm will be answered the following business day.   You may also send [place] a message via [place]. We  typically respond to MyChart messages within 1-2 business days.  For prescription refills, please ask your pharmacy to contact our office. Our fax number is [phone].  If you have an urgent issue when the clinic is closed that cannot wait until the next business day, you can page your doctor at the number below.    Please note that while we do our best to be available for urgent issues outside of office hours, we are not available 24/7.   If you have an urgent issue and are unable to reach [place], you may choose to seek medical care at your doctor's office, retail clinic, urgent care center, or emergency room.  If you have a medical emergency, please immediately call 911 or go to the emergency department.  Pager Numbers  - Dr. Nehemiah Massed: [phone]  - Dr. Laurence Ferrari: [phone]  - Dr. Nicole Kindred: [phone]  In the event of inclement weather, please call our main line at [phone] for an update on the status of any delays or closures.  Dermatology Medication Tips: Please keep the boxes that topical medications come in in order to help keep track of the instructions about where and how to use these. Pharmacies typically print the medication instructions only on the boxes and not directly on the medication tubes.   If your medication is too expensive, please contact our office at [phone] option 4 or send [place] a message through [place].   We are unable to tell what your co-pay for medications will be in advance as this is different depending on your insurance coverage. However, we may be able to find a substitute medication at lower cost or fill out paperwork to get insurance to cover a needed medication.   If a prior authorization is required to get your medication covered by your insurance company, please allow [place] 1-2 business days to complete this process.  Drug prices often vary depending on where the prescription is filled and some pharmacies may offer cheaper prices.  The  website www.goodrx.com contains coupons for medications through different pharmacies. The prices here do not account for what the cost may be with help from insurance (it may be cheaper with your insurance), but the website can give you the price if you did not use any insurance.  - You can print the associated coupon and take it with your prescription to the pharmacy.  - You may also stop by our office during regular business hours and pick up a GoodRx coupon card.  - If you need your prescription sent electronically to a different pharmacy, notify our office through [facility] or by phone at [phone] option 4.

## 2021-05-07 ENCOUNTER — Encounter: Payer: Self-pay | Admitting: Dermatology

## 2021-05-08 ENCOUNTER — Other Ambulatory Visit: Payer: Self-pay

## 2021-05-08 NOTE — Progress Notes (Signed)
Error

## 2021-06-06 ENCOUNTER — Other Ambulatory Visit: Payer: Self-pay

## 2021-06-06 DIAGNOSIS — R21 Rash and other nonspecific skin eruption: Secondary | ICD-10-CM

## 2021-07-05 DIAGNOSIS — Q8500 Neurofibromatosis, unspecified: Secondary | ICD-10-CM | POA: Insufficient documentation

## 2021-07-05 DIAGNOSIS — D3610 Benign neoplasm of peripheral nerves and autonomic nervous system, unspecified: Secondary | ICD-10-CM | POA: Insufficient documentation

## 2021-08-12 ENCOUNTER — Other Ambulatory Visit: Payer: Self-pay | Admitting: Internal Medicine

## 2021-08-12 DIAGNOSIS — Z1231 Encounter for screening mammogram for malignant neoplasm of breast: Secondary | ICD-10-CM

## 2021-08-14 ENCOUNTER — Encounter: Payer: Self-pay | Admitting: Dermatology

## 2021-08-14 ENCOUNTER — Other Ambulatory Visit: Payer: Self-pay

## 2021-08-14 ENCOUNTER — Ambulatory Visit: Payer: Medicare HMO | Admitting: Dermatology

## 2021-08-14 DIAGNOSIS — Z872 Personal history of diseases of the skin and subcutaneous tissue: Secondary | ICD-10-CM

## 2021-08-14 DIAGNOSIS — L821 Other seborrheic keratosis: Secondary | ICD-10-CM | POA: Diagnosis not present

## 2021-08-14 DIAGNOSIS — Q8501 Neurofibromatosis, type 1: Secondary | ICD-10-CM | POA: Diagnosis not present

## 2021-08-14 DIAGNOSIS — L570 Actinic keratosis: Secondary | ICD-10-CM | POA: Diagnosis not present

## 2021-08-14 DIAGNOSIS — L578 Other skin changes due to chronic exposure to nonionizing radiation: Secondary | ICD-10-CM | POA: Diagnosis not present

## 2021-08-14 MED ORDER — FLUOROURACIL 5 % EX CREA: TOPICAL_CREAM | Freq: Two times a day (BID) | CUTANEOUS | 0 refills | Status: DC

## 2021-08-14 NOTE — Patient Instructions (Addendum)
Start 5-fluorouracil/calcipotriene cream twice a day for 4 days to affected areas including left upper lip, nose. Prescription sent to [facility]. Patient provided with contact information for pharmacy and advised the pharmacy will mail the prescription to their home. Patient provided with handout reviewing treatment course and side effects and advised to call or message [place] on MyChart with any concerns.  Recommend daily broad spectrum sunscreen SPF 30+ to sun-exposed areas, reapply every 2 hours as needed. Call for new or changing lesions.  Staying in the shade or wearing long sleeves, sun glasses (UVA+UVB protection) and wide brim hats (4-inch brim around the entire circumference of the hat) are also recommended for sun protection.   If you have any questions or concerns for your doctor, please call our main line at [phone] and press option 4 to reach your doctor's medical assistant. If no one answers, please leave a voicemail as directed and we will return your call as soon as possible. Messages left after 4 pm will be answered the following business day.   You may also send [place] a message via [place]. We typically respond to MyChart messages within 1-2 business days.  For prescription refills, please ask your pharmacy to contact our office. Our fax number is [phone].  If you have an urgent issue when the clinic is closed that cannot wait until the next business day, you can page your doctor at the number below.    Please note that while we do our best to be available for urgent issues outside of office hours, we are not available 24/7.   If you have an urgent issue and are unable to reach [place], you may choose to seek medical care at your doctor's office, retail clinic, urgent care center, or emergency room.  If you have a medical emergency, please immediately call 911 or go to the emergency department.  Pager Numbers  - Dr. Nehemiah Massed: [phone]  - Dr. Laurence Ferrari: [phone]  -  Dr. Nicole Kindred: [phone]  In the event of inclement weather, please call our main line at [phone] for an update on the status of any delays or closures.  Dermatology Medication Tips: Please keep the boxes that topical medications come in in order to help keep track of the instructions about where and how to use these. Pharmacies typically print the medication instructions only on the boxes and not directly on the medication tubes.   If your medication is too expensive, please contact our office at [phone] option 4 or send [place] a message through [place].   We are unable to tell what your co-pay for medications will be in advance as this is different depending on your insurance coverage. However, we may be able to find a substitute medication at lower cost or fill out paperwork to get insurance to cover a needed medication.   If a prior authorization is required to get your medication covered by your insurance company, please allow [place] 1-2 business days to complete this process.  Drug prices often vary depending on where the prescription is filled and some pharmacies may offer cheaper prices.  The website www.goodrx.com contains coupons for medications through different pharmacies. The prices here do not account for what the cost may be with help from insurance (it may be cheaper with your insurance), but the website can give you the price if you did not use any insurance.  - You can print the associated coupon and take it with your prescription to the pharmacy.  - You may  also stop by our office during regular business hours and pick up a GoodRx coupon card.  - If you need your prescription sent electronically to a different pharmacy, notify our office through [facility] or by phone at [phone] option 4.

## 2021-08-14 NOTE — Progress Notes (Signed)
Follow-Up Visit   Subjective  Lori Thornton is a 75 y.o. female who presents for the following: Follow-up (Patient here today for 3 month AK follow up. Areas at nose, lip and right 2nd finger treated with LN2 at last visit. Patient advises there is still some roughness at lip and nose, as well as another spot at left lower leg. ).  Patient was referred to Neurofibromatosis clinic at [facility] and had her appointment August 1st.   The following portions of the chart were reviewed this encounter and updated as appropriate:   Tobacco  Allergies  Meds  Problems  Med Hx  Surg Hx  Fam Hx      Review of Systems:  No other skin or systemic complaints except as noted in HPI or Assessment and Plan.  Objective  Well appearing patient in no apparent distress; mood and affect are within normal limits.  A focused examination was performed including face, legs, hands and fingers. Relevant physical exam findings are noted in the Assessment and Plan.  Left upper cutaneous lip x 1, possible at nasal dorsum Erythematous thin papules/macules with gritty scale.    Assessment & Plan  AK (actinic keratosis) Left upper cutaneous lip x 1, possible at nasal dorsum  Start 5-fluorouracil/calcipotriene cream twice a day for 4 days to affected areas including left upper lip, nose. Prescription sent to [facility]. Patient provided with contact information for pharmacy and advised the pharmacy will mail the prescription to their home. Patient provided with handout reviewing treatment course and side effects and advised to call or message [place] on MyChart with any concerns.   Actinic keratoses are precancerous spots that appear secondary to cumulative UV radiation exposure/sun exposure over time. They are chronic with expected duration over 1 year. A portion of actinic keratoses will progress to squamous cell carcinoma of the skin. It is not possible to reliably predict which spots will progress to skin  cancer and so treatment is recommended to prevent development of skin cancer.  Recommend daily broad spectrum sunscreen SPF 30+ to sun-exposed areas, reapply every 2 hours as needed.  Recommend staying in the shade or wearing long sleeves, sun glasses (UVA+UVB protection) and wide brim hats (4-inch brim around the entire circumference of the hat). Call for new or changing lesions.   fluorouracil (EFUDEX) 5 % cream - Left upper cutaneous lip x 1, possible at nasal dorsum Apply topically 2 (two) times daily. Apply twice daily for 4 days to affected areas as directed.  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 No concerns at this time.  History of PreCancerous Actinic Keratosis  - site(s) of PreCancerous Actinic Keratosis clear today. - these may recur and new lesions may form requiring treatment to prevent transformation into skin cancer - observe for new or changing spots and contact [place] for appointment if occur - photoprotection with sun protective clothing; sunglasses and broad spectrum sunscreen with SPF of at least 30 + and frequent self skin exams recommended - yearly exams by a dermatologist recommended for persons with history of PreCancerous Actinic Keratoses  Actinic Damage - chronic, secondary to cumulative UV radiation exposure/sun exposure over time - diffuse scaly erythematous macules with underlying dyspigmentation - Recommend daily broad spectrum sunscreen SPF 30+ to sun-exposed areas, reapply every 2 hours as needed.  - Recommend staying in the shade or wearing long sleeves, sun glasses (UVA+UVB protection) and wide brim hats (  4-inch brim around the entire circumference of the hat). - Call for new or changing lesions.  Seborrheic Keratoses - Stuck-on, waxy, tan-brown papules and/or plaques  - Benign-appearing - Discussed benign etiology and prognosis. -  Observe - Call for any changes   Return for 3-4 months for AK follow up.  Graciella Belton, RMA, am acting as scribe for Forest Gleason, MD .  Documentation: I have reviewed the above documentation for accuracy and completeness, and I agree with the above.  Forest Gleason, MD

## 2021-09-20 ENCOUNTER — Other Ambulatory Visit: Payer: Self-pay

## 2021-09-20 ENCOUNTER — Ambulatory Visit
Admission: RE | Admit: 2021-09-20 | Discharge: 2021-09-20 | Disposition: A | Discharge status: Home / Self Care | Payer: Medicare HMO | Source: Ambulatory Visit | Attending: Internal Medicine | Admitting: Internal Medicine

## 2021-09-20 DIAGNOSIS — Z1231 Encounter for screening mammogram for malignant neoplasm of breast: Secondary | ICD-10-CM | POA: Insufficient documentation

## 2021-12-12 ENCOUNTER — Ambulatory Visit: Payer: Medicare HMO | Admitting: Dermatology

## 2022-05-01 ENCOUNTER — Encounter: Payer: Medicare HMO | Admitting: Dermatology

## 2022-07-02 ENCOUNTER — Ambulatory Visit (INDEPENDENT_AMBULATORY_CARE_PROVIDER_SITE_OTHER): Payer: Medicare HMO | Admitting: Dermatology

## 2022-07-02 ENCOUNTER — Encounter: Payer: Self-pay | Admitting: Dermatology

## 2022-07-02 DIAGNOSIS — Q8500 Neurofibromatosis, unspecified: Secondary | ICD-10-CM

## 2022-07-02 DIAGNOSIS — Z1283 Encounter for screening for malignant neoplasm of skin: Secondary | ICD-10-CM | POA: Diagnosis not present

## 2022-07-02 DIAGNOSIS — L570 Actinic keratosis: Secondary | ICD-10-CM

## 2022-07-02 DIAGNOSIS — B351 Tinea unguium: Secondary | ICD-10-CM

## 2022-07-02 DIAGNOSIS — D229 Melanocytic nevi, unspecified: Secondary | ICD-10-CM

## 2022-07-02 DIAGNOSIS — L578 Other skin changes due to chronic exposure to nonionizing radiation: Secondary | ICD-10-CM

## 2022-07-02 DIAGNOSIS — L814 Other melanin hyperpigmentation: Secondary | ICD-10-CM

## 2022-07-02 DIAGNOSIS — L821 Other seborrheic keratosis: Secondary | ICD-10-CM

## 2022-07-02 DIAGNOSIS — D1800 Hemangioma unspecified site: Secondary | ICD-10-CM

## 2022-07-02 MED ORDER — FLUOROURACIL 5 % EX CREA
TOPICAL_CREAM | Freq: Two times a day (BID) | CUTANEOUS | 0 refills | Status: DC
Start: 2022-07-02 — End: 2023-08-24

## 2022-07-02 MED ORDER — CICLOPIROX OLAMINE 0.77 % EX CREA: TOPICAL_CREAM | CUTANEOUS | 5 refills | Status: AC

## 2022-07-02 NOTE — Progress Notes (Signed)
Follow-Up Visit   Subjective  Lori Thornton is a 76 y.o. female who presents for the following: Annual Exam (The patient presents for Total-Body Skin Exam (TBSE) for skin cancer screening and mole check.  The patient has spots, moles and lesions to be evaluated, some may be new or changing and the patient has concerns that these could be cancer. Patient with hx of AK's./).  Patient did use 5FU/calcipotriene last year at left upper lip and nose, she still feels something there.   Patient with Neurofibromatosis and is being followed by [facility].   The following portions of the chart were reviewed this encounter and updated as appropriate:   Tobacco  Allergies  Meds  Problems  Med Hx  Surg Hx  Fam Hx      Review of Systems:  No other skin or systemic complaints except as noted in HPI or Assessment and Plan.  Objective  Well appearing patient in no apparent distress; mood and affect are within normal limits.  A full examination was performed including scalp, head, eyes, ears, nose, lips, neck, chest, axillae, abdomen, back, buttocks, bilateral upper extremities, bilateral lower extremities, hands, feet, fingers, toes, fingernails, and toenails. All findings within normal limits unless otherwise noted below.  Right Forearm - Anterior Multiple soft fleshy plaques and brown patches  Nose Erythematous thin papules/macules with gritty scale.   b/l feet Toenails thickened with subungual debris    Assessment & Plan  Neurofibromatosis ([place]) Right Forearm - Anterior  Type 1  [facility] neurologist retired  Will send referral to [facility] per patient's request  AK (actinic keratosis) Nose  Actinic keratoses are precancerous spots that appear secondary to cumulative UV radiation exposure/sun exposure over time. They are chronic with expected duration over 1 year. A portion of actinic keratoses will progress to squamous cell carcinoma of the skin. It is not possible to  reliably predict which spots will progress to skin cancer and so treatment is recommended to prevent development of skin cancer.  Recommend daily broad spectrum sunscreen SPF 30+ to sun-exposed areas, reapply every 2 hours as needed.  Recommend staying in the shade or wearing long sleeves, sun glasses (UVA+UVB protection) and wide brim hats (4-inch brim around the entire circumference of the hat). Call for new or changing lesions.  Start 5-fluorouracil/calcipotriene cream twice a day for 7-10 days to affected areas including left upper lip, nose. Prescription sent to [facility]. Patient provided with contact information for pharmacy and advised the pharmacy will mail the prescription to their home. Patient provided with handout reviewing treatment course and side effects and advised to call or message [place] on MyChart with any concerns.  Repeat in 1 month if any residual scale or roughness  Related Medications fluorouracil (EFUDEX) 5 % cream Apply topically 2 (two) times daily. Apply twice daily for 4 days to affected areas as directed.  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   Continue ciclopirox cream once weekly to feet to prevent recurrent tinea pedis  Discussed option for treatment of toenails including PO terbinafine, topicals. Pt defers nail treatment at this time after discussion of options  Related Medications ciclopirox (LOPROX) 0.77 % cream Apply to feet once a week.   Lentigines - Scattered tan macules - Due to sun exposure - Benign-appearing, observe - Recommend daily broad spectrum sunscreen SPF 30+ to sun-exposed areas, reapply every 2 hours as needed. - Call for any changes  Seborrheic Keratoses - Stuck-on, waxy,  tan-brown papules and/or plaques  - Benign-appearing - Discussed benign etiology and prognosis. - Observe - Call for any changes  Melanocytic Nevi - Tan-brown and/or pink-flesh-colored symmetric macules and  papules - Benign appearing on exam today - Observation - Call clinic for new or changing moles - Recommend daily use of broad spectrum spf 30+ sunscreen to sun-exposed areas.   Hemangiomas - Red papules - Discussed benign nature - Observe - Call for any changes  Actinic Damage - Chronic condition, secondary to cumulative UV/sun exposure - diffuse scaly erythematous macules with underlying dyspigmentation - Recommend daily broad spectrum sunscreen SPF 30+ to sun-exposed areas, reapply every 2 hours as needed.  - Staying in the shade or wearing long sleeves, sun glasses (UVA+UVB protection) and wide brim hats (4-inch brim around the entire circumference of the hat) are also recommended for sun protection.  - Call for new or changing lesions.  Skin cancer screening performed today.  Return in about 1 year (around 07/03/2023) for TBSE.  Graciella Belton, RMA, am acting as scribe for Forest Gleason, MD .  Documentation: I have reviewed the above documentation for accuracy and completeness, and I agree with the above.  Forest Gleason, MD

## 2022-07-02 NOTE — Patient Instructions (Addendum)
Start 5-fluorouracil/calcipotriene cream twice a day for 7-10 days to affected areas including left upper lip, nose. Prescription sent to [facility]. Patient provided with contact information for pharmacy and advised the pharmacy will mail the prescription to their home. Patient provided with handout reviewing treatment course and side effects and advised to call or message [place] on MyChart with any concerns.  Repeat in 1 month.   Recommend taking Heliocare sun protection supplement daily in sunny weather for additional sun protection. For maximum protection on the sunniest days, you can take up to 2 capsules of regular Heliocare OR take 1 capsule of Heliocare Ultra. For prolonged exposure (such as a full day in the sun), you can repeat your dose of the supplement 4 hours after your first dose. Heliocare can be purchased at [employer], at some [facility] or at [email or web address].    Melanoma ABCDEs  Melanoma is the most dangerous type of skin cancer, and is the leading cause of death from skin disease.  You are more likely to develop melanoma if you: Have light-colored skin, light-colored eyes, or red or blond hair Spend a lot of time in the sun Tan regularly, either outdoors or in a tanning bed Have had blistering sunburns, especially during childhood Have a close family member who has had a melanoma Have atypical moles or large birthmarks  Early detection of melanoma is key since treatment is typically straightforward and cure rates are extremely high if we catch it early.   The first sign of melanoma is often a change in a mole or a new dark spot.  The ABCDE system is a way of remembering the signs of melanoma.  A for asymmetry:  The two halves do not match. B for border:  The edges of the growth are irregular. C for color:  A mixture of colors are present instead of an even brown color. D for diameter:  Melanomas are usually (but not always) greater than 42m - the size of a  pencil eraser. E for evolution:  The spot keeps changing in size, shape, and color.  Please check your skin once per month between visits. You can use a small mirror in front and a large mirror behind you to keep an eye on the back side or your body.   If you see any new or changing lesions before your next follow-up, please call to schedule a visit.  Please continue daily skin protection including broad spectrum sunscreen SPF 30+ to sun-exposed areas, reapplying every 2 hours as needed when you're outdoors.    Due to recent changes in healthcare laws, you may see results of your pathology and/or laboratory studies on MyChart before the doctors have had a chance to review them. We understand that in some cases there may be results that are confusing or concerning to you. Please understand that not all results are received at the same time and often the doctors may need to interpret multiple results in order to provide you with the best plan of care or course of treatment. Therefore, we ask that you please give u[place]2 business days to thoroughly review all your results before contacting the office for clarification. Should we see a critical lab result, you will be contacted sooner.   If You Need Anything After Your Visit  If you have any questions or concerns for your doctor, please call our main line at [phone] press option 4 to reach your doctor's medical assistant. If no one answers, please  leave a voicemail as directed and we will return your call as soon as possible. Messages left after 4 pm will be answered the following business day.   You may also send [place] a message via [place]. We typically respond to MyChart messages within 1-2 business days.  For prescription refills, please ask your pharmacy to contact our office. Our fax number is [phone].  If you have an urgent issue when the clinic is closed that cannot wait until the next business day, you can page your doctor at the  number below.    Please note that while we do our best to be available for urgent issues outside of office hours, we are not available 24/7.   If you have an urgent issue and are unable to reach [place], you may choose to seek medical care at your doctor's office, retail clinic, urgent care center, or emergency room.  If you have a medical emergency, please immediately call 911 or go to the emergency department.  Pager Numbers  - Dr. Nehemiah Massed: [phone]  - Dr. Laurence Ferrari: [phone]  - Dr. Nicole Kindred: [phone]  In the event of inclement weather, please call our main line at [phone] for an update on the status of any delays or closures.  Dermatology Medication Tips: Please keep the boxes that topical medications come in in order to help keep track of the instructions about where and how to use these. Pharmacies typically print the medication instructions only on the boxes and not directly on the medication tubes.   If your medication is too expensive, please contact our office at [phone] option 4 or send [place] a message through [place].   We are unable to tell what your co-pay for medications will be in advance as this is different depending on your insurance coverage. However, we may be able to find a substitute medication at lower cost or fill out paperwork to get insurance to cover a needed medication.   If a prior authorization is required to get your medication covered by your insurance company, please allow [place] 1-2 business days to complete this process.  Drug prices often vary depending on where the prescription is filled and some pharmacies may offer cheaper prices.  The website www.goodrx.com contains coupons for medications through different pharmacies. The prices here do not account for what the cost may be with help from insurance (it may be cheaper with your insurance), but the website can give you the price if you did not use any insurance.  - You can print the associated  coupon and take it with your prescription to the pharmacy.  - You may also stop by our office during regular business hours and pick up a GoodRx coupon card.  - If you need your prescription sent electronically to a different pharmacy, notify our office through [facility] or by phone at [phone] option 4.     Si Usted Necesita Algo Despus de Su Visita  Tambin puede enviarnos un mensaje a travs de Pharmacist, community. Por lo general respondemos a los mensajes de MyChart en el transcurso de 1 a 2 das hbiles.  Para renovar recetas, por favor pida a su farmacia que se ponga en contacto con nuestra oficina. Harland Dingwall de fax es [place] [phone].  Si tiene un asunto urgente cuando la clnica est cerrada y que no puede esperar hasta el siguiente da hbil, puede llamar/localizar a su doctor(a) al nmero que aparece a continuacin.   Por favor, tenga en cuenta que aunque hacemos  todo lo posible para estar disponibles para asuntos urgentes fuera del horario de oficina, no estamos disponibles las 24 horas del da, los 7 das de la [place].   Si tiene un problema urgente y no puede comunicarse con nosotros, puede optar por buscar atencin mdica  en el consultorio de su doctor(a), en una clnica privada, en un centro de atencin urgente o en una sala de emergencias.  Si tiene Engineering geologist, por favor llame inmediatamente al 911 o vaya a la sala de emergencias.  Nmeros de bper  - Dr. Nehemiah Massed: [phone]  - Dra. Moye: [phone]  - Dra. Nicole Kindred: [phone]  En caso de inclemencias del [place], por favor llame a Johnsie Kindred principal al [phone] para una actualizacin sobre el [place] de cualquier retraso o cierre.  Consejos para la medicacin en dermatologa: Por favor, guarde las cajas en las que vienen los medicamentos de uso tpico para ayudarle a seguir las instrucciones sobre dnde y cmo usarlos. Las farmacias generalmente imprimen las instrucciones del  medicamento slo en las cajas y no directamente en los tubos del [place].   Si su medicamento es muy caro, por favor, pngase en contacto con Zigmund Daniel llamando al [phone] y presione la opcin 4 o envenos un mensaje a travs de Pharmacist, community.   No podemos decirle cul ser su copago por los medicamentos por adelantado ya que esto es diferente dependiendo de la cobertura de su seguro. Sin embargo, es posible que podamos encontrar un medicamento sustituto a Electrical engineer un formulario para que el seguro cubra el medicamento que se considera necesario.   Si se requiere una autorizacin previa para que su compaa de seguros [place] su medicamento, por favor permtanos de 1 a 2 das hbiles para completar este proceso.  Los precios de los medicamentos varan con frecuencia dependiendo del Environmental consultant de dnde se surte la receta y alguna farmacias pueden ofrecer precios ms baratos.  El sitio web www.goodrx.com tiene cupones para medicamentos de Airline pilot. Los precios aqu no tienen en cuenta lo que podra costar con la ayuda del seguro (puede ser ms barato con su seguro), pero el sitio web puede darle el precio si no utiliz Research scientist (physical sciences).  - Puede imprimir el cupn correspondiente y llevarlo con su receta a la farmacia.  - Tambin puede pasar por nuestra oficina durante el horario de atencin regular y Charity fundraiser una tarjeta de cupones de GoodRx.  - Si necesita que su receta se enve electrnicamente a una farmacia diferente, informe a nuestra oficina a travs de MyChart de [place] o por telfono llamando al [phone] y presione la opcin 4.

## 2022-08-12 ENCOUNTER — Other Ambulatory Visit: Payer: Self-pay | Admitting: Internal Medicine

## 2022-08-12 DIAGNOSIS — Z1231 Encounter for screening mammogram for malignant neoplasm of breast: Secondary | ICD-10-CM

## 2022-09-23 ENCOUNTER — Ambulatory Visit
Admission: RE | Admit: 2022-09-23 | Discharge: 2022-09-23 | Disposition: A | Discharge status: Home / Self Care | Payer: Medicare HMO | Source: Ambulatory Visit | Attending: Internal Medicine | Admitting: Internal Medicine

## 2022-09-23 DIAGNOSIS — Z1231 Encounter for screening mammogram for malignant neoplasm of breast: Secondary | ICD-10-CM | POA: Insufficient documentation

## 2023-07-16 ENCOUNTER — Encounter: Payer: Medicare HMO | Admitting: Dermatology

## 2023-08-24 ENCOUNTER — Ambulatory Visit (INDEPENDENT_AMBULATORY_CARE_PROVIDER_SITE_OTHER): Payer: Medicare HMO | Admitting: Dermatology

## 2023-08-24 ENCOUNTER — Encounter: Payer: Self-pay | Admitting: Dermatology

## 2023-08-24 VITALS — BP 144/81 | HR 77

## 2023-08-24 DIAGNOSIS — L578 Other skin changes due to chronic exposure to nonionizing radiation: Secondary | ICD-10-CM

## 2023-08-24 DIAGNOSIS — L570 Actinic keratosis: Secondary | ICD-10-CM | POA: Diagnosis not present

## 2023-08-24 DIAGNOSIS — L308 Other specified dermatitis: Secondary | ICD-10-CM | POA: Diagnosis not present

## 2023-08-24 DIAGNOSIS — L814 Other melanin hyperpigmentation: Secondary | ICD-10-CM | POA: Diagnosis not present

## 2023-08-24 DIAGNOSIS — W908XXA Exposure to other nonionizing radiation, initial encounter: Secondary | ICD-10-CM | POA: Diagnosis not present

## 2023-08-24 DIAGNOSIS — L821 Other seborrheic keratosis: Secondary | ICD-10-CM

## 2023-08-24 DIAGNOSIS — D492 Neoplasm of unspecified behavior of bone, soft tissue, and skin: Secondary | ICD-10-CM

## 2023-08-24 DIAGNOSIS — Z1283 Encounter for screening for malignant neoplasm of skin: Secondary | ICD-10-CM | POA: Diagnosis not present

## 2023-08-24 DIAGNOSIS — L813 Cafe au lait spots: Secondary | ICD-10-CM

## 2023-08-24 DIAGNOSIS — D1801 Hemangioma of skin and subcutaneous tissue: Secondary | ICD-10-CM

## 2023-08-24 DIAGNOSIS — D229 Melanocytic nevi, unspecified: Secondary | ICD-10-CM

## 2023-08-24 DIAGNOSIS — D3610 Benign neoplasm of peripheral nerves and autonomic nervous system, unspecified: Secondary | ICD-10-CM

## 2023-08-24 DIAGNOSIS — Q8500 Neurofibromatosis, unspecified: Secondary | ICD-10-CM

## 2023-08-24 NOTE — Patient Instructions (Addendum)

## 2023-08-24 NOTE — Progress Notes (Signed)
Follow-Up Visit   Subjective  Lori Thornton is a 77 y.o. female who presents for the following: Skin Cancer Screening and Full Body Skin Exam, history of precancers.  The patient presents for Total-Body Skin Exam (TBSE) for skin cancer screening and mole check. The patient has spots, moles and lesions to be evaluated, some may be new or changing and the patient may have concern these could be cancer.   The following portions of the chart were reviewed this encounter and updated as appropriate: medications, allergies, medical history  Review of Systems:  No other skin or systemic complaints except as noted in HPI or Assessment and Plan.  Objective  Well appearing patient in no apparent distress; mood and affect are within normal limits.  A full examination was performed including scalp, head, eyes, ears, nose, lips, neck, chest, axillae, abdomen, back, buttocks, bilateral upper extremities, bilateral lower extremities, hands, feet, fingers, toes, fingernails, and toenails. All findings within normal limits unless otherwise noted below.   Relevant physical exam findings are noted in the Assessment and Plan.  right nasal tip x 1, left clavicle x 1 (2) Erythematous thin papules/macules with gritty scale.   left upper arm Left upper arm, edge of axillary vault  3 pink scaly pink papules     Exam of face limited by presence of make-up. Exam of nails limited by presence of nail polish.  Assessment & Plan   SKIN CANCER SCREENING PERFORMED TODAY.  Exam of toes limited by presence of toenail polish.   ACTINIC DAMAGE - Chronic condition, secondary to cumulative UV/sun exposure - diffuse scaly erythematous macules with underlying dyspigmentation - Recommend daily broad spectrum sunscreen SPF 30+ to sun-exposed areas, reapply every 2 hours as needed.  - Staying in the shade or wearing long sleeves, sun glasses (UVA+UVB protection) and wide brim hats (4-inch brim around the entire  circumference of the hat) are also recommended for sun protection.  - Call for new or changing lesions.  LENTIGINES, SEBORRHEIC KERATOSES, HEMANGIOMAS, cafe au lait psots, neurofibromas - Benign normal skin lesions - Benign-appearing - Call for any changes  MELANOCYTIC NEVI - Tan-brown and/or pink-flesh-colored symmetric macules and papules - Benign appearing on exam today - Observation - Call clinic for new or changing moles - Recommend daily use of broad spectrum spf 30+ sunscreen to sun-exposed areas.   AK (actinic keratosis) (2) right nasal tip x 1, left clavicle x 1  Actinic keratoses are precancerous spots that appear secondary to cumulative UV radiation exposure/sun exposure over time. They are chronic with expected duration over 1 year. A portion of actinic keratoses will progress to squamous cell carcinoma of the skin. It is not possible to reliably predict which spots will progress to skin cancer and so treatment is recommended to prevent development of skin cancer.  Recommend daily broad spectrum sunscreen SPF 30+ to sun-exposed areas, reapply every 2 hours as needed.  Recommend staying in the shade or wearing long sleeves, sun glasses (UVA+UVB protection) and wide brim hats (4-inch brim around the entire circumference of the hat). Call for new or changing lesions.   Destruction of lesion - right nasal tip x 1, left clavicle x 1 (2) Complexity: simple   Destruction method: cryotherapy   Informed consent: discussed and consent obtained   Timeout:  patient name, date of birth, surgical site, and procedure verified Lesion destroyed using liquid nitrogen: Yes   Region frozen until ice ball extended beyond lesion: Yes   Outcome: patient tolerated procedure  well with no complications   Post-procedure details: wound care instructions given    Neoplasm of skin left upper arm  Skin / nail biopsy Type of biopsy: tangential   Informed consent: discussed and consent obtained    Patient was prepped and draped in usual sterile fashion: area prepped with alochol. Anesthesia: the lesion was anesthetized in a standard fashion   Anesthetic:  1% lidocaine w/ epinephrine 1-100,000 buffered w/ 8.4% NaHCO3 Instrument used: flexible razor blade   Hemostasis achieved with: pressure, aluminum chloride and electrodesiccation   Outcome: patient tolerated procedure well   Post-procedure details: wound care instructions given   Post-procedure details comment:  Ointment and small bandage  Specimen 1 - Surgical pathology Differential Diagnosis: Bullous pemphigoid vs bullous arthropod  vs Grover's disease  Check Margins: No   Neurofibromatosis (HCC) Right Forearm - Multiple soft fleshy plaques and brown patches  - Benign-appearing - Call for any changes   Return in about 1 year (around 08/23/2024) for TBSE, hx of Aks .  IAngelique Holm, CMA, am acting as scribe for Elie Goody, MD .   Documentation: I have reviewed the above documentation for accuracy and completeness, and I agree with the above.  Elie Goody, MD

## 2023-08-27 ENCOUNTER — Other Ambulatory Visit: Payer: Self-pay | Admitting: Internal Medicine

## 2023-08-27 DIAGNOSIS — Z1231 Encounter for screening mammogram for malignant neoplasm of breast: Secondary | ICD-10-CM

## 2023-09-08 ENCOUNTER — Other Ambulatory Visit: Payer: Self-pay | Admitting: Internal Medicine

## 2023-09-08 DIAGNOSIS — Z0000 Encounter for general adult medical examination without abnormal findings: Secondary | ICD-10-CM

## 2023-09-10 ENCOUNTER — Ambulatory Visit
Admission: RE | Admit: 2023-09-10 | Discharge: 2023-09-10 | Disposition: A | Discharge status: Home / Self Care | Payer: Medicare HMO | Source: Ambulatory Visit | Attending: Internal Medicine | Admitting: Internal Medicine

## 2023-09-10 DIAGNOSIS — Z0000 Encounter for general adult medical examination without abnormal findings: Secondary | ICD-10-CM | POA: Diagnosis present

## 2023-09-28 ENCOUNTER — Telehealth: Payer: Self-pay | Admitting: Dermatology

## 2023-09-28 ENCOUNTER — Telehealth: Payer: Self-pay

## 2023-09-28 NOTE — Telephone Encounter (Signed)
-----   Message ----- From: Elie Goody, MD Sent: 09/16/2023   9:34 PM EDT To: Katrinka Blazing Clinical Subject: missed biopsy                                  Hi team,  Please call and apologize for delay in sharing result. Tell patient it happened due to a glitch in our system that we are trying to get fixed. Biopsy shows chronic eczema-like changes with wounds in the skin. Please ask the patient how the spots form and how they resolve (she shared this during the visit but it was not recorded in the note). Offer appointment if patient wants to discuss treatments for eczema.  Thank you, Dr Katrinka Blazing

## 2023-09-28 NOTE — Telephone Encounter (Signed)
Spoke with patient and advised pathology results from 8/26 showed eczema-like changes. Patient advises spots come up as a red spot with a white dot in the middle and lasts for 2-3 days then forms a scab. Patient will treat with neosporin and resolves. Total duration about 1 week from onset. Patient has not had any new spots come up and defers appt to talk about treatment. Butch Orner., RMA

## 2023-09-29 ENCOUNTER — Ambulatory Visit
Admission: RE | Admit: 2023-09-29 | Discharge: 2023-09-29 | Disposition: A | Discharge status: Home / Self Care | Payer: Medicare HMO | Source: Ambulatory Visit | Attending: Internal Medicine | Admitting: Internal Medicine

## 2023-09-29 DIAGNOSIS — Z1231 Encounter for screening mammogram for malignant neoplasm of breast: Secondary | ICD-10-CM | POA: Insufficient documentation

## 2024-02-19 ENCOUNTER — Encounter: Payer: Self-pay | Admitting: Gastroenterology

## 2024-03-07 ENCOUNTER — Ambulatory Visit: Admitting: Anesthesiology

## 2024-03-07 ENCOUNTER — Encounter
Admission: RE | Disposition: A | Discharge status: Home / Self Care | Payer: Self-pay | Source: Home / Self Care | Attending: Gastroenterology

## 2024-03-07 ENCOUNTER — Ambulatory Visit
Admission: RE | Admit: 2024-03-07 | Discharge: 2024-03-07 | Disposition: A | Discharge status: Home / Self Care | Payer: Medicare HMO | Attending: Gastroenterology | Admitting: Gastroenterology

## 2024-03-07 ENCOUNTER — Encounter: Payer: Self-pay | Admitting: Gastroenterology

## 2024-03-07 DIAGNOSIS — Z1211 Encounter for screening for malignant neoplasm of colon: Secondary | ICD-10-CM | POA: Insufficient documentation

## 2024-03-07 DIAGNOSIS — D124 Benign neoplasm of descending colon: Secondary | ICD-10-CM | POA: Insufficient documentation

## 2024-03-07 DIAGNOSIS — K640 First degree hemorrhoids: Secondary | ICD-10-CM | POA: Diagnosis not present

## 2024-03-07 DIAGNOSIS — Z90710 Acquired absence of both cervix and uterus: Secondary | ICD-10-CM | POA: Diagnosis not present

## 2024-03-07 DIAGNOSIS — Z9049 Acquired absence of other specified parts of digestive tract: Secondary | ICD-10-CM | POA: Insufficient documentation

## 2024-03-07 HISTORY — DX: Polyosteoarthritis, unspecified: M15.9

## 2024-03-07 HISTORY — DX: Presence of intraocular lens: Z96.1

## 2024-03-07 HISTORY — DX: Other specified disorders of bone density and structure, unspecified site: M85.80

## 2024-03-07 HISTORY — DX: Neoplasm of uncertain behavior of left ovary: D39.12

## 2024-03-07 SURGERY — COLONOSCOPY WITH PROPOFOL
Anesthesia: General

## 2024-03-07 MED ORDER — PROPOFOL 10 MG/ML IV BOLUS
INTRAVENOUS | Status: DC | PRN
  Administered 2024-03-07: 30 mg via INTRAVENOUS
  Administered 2024-03-07: 10 mg via INTRAVENOUS

## 2024-03-07 MED ORDER — PROPOFOL 500 MG/50ML IV EMUL
INTRAVENOUS | Status: DC | PRN
  Administered 2024-03-07: 75 ug/kg/min via INTRAVENOUS

## 2024-03-07 MED ORDER — SODIUM CHLORIDE 0.9 % IV SOLN
INTRAVENOUS | Status: DC
  Administered 2024-03-07: 20 mL/h via INTRAVENOUS

## 2024-03-07 MED ORDER — LIDOCAINE HCL (PF) 2 % IJ SOLN
INTRAMUSCULAR | Status: AC
  Filled 2024-03-07: qty 5

## 2024-03-07 MED ORDER — LIDOCAINE HCL (CARDIAC) PF 100 MG/5ML IV SOSY
PREFILLED_SYRINGE | INTRAVENOUS | Status: DC | PRN
  Administered 2024-03-07: 50 mg via INTRAVENOUS

## 2024-03-07 NOTE — Transfer of Care (Signed)
 Immediate Anesthesia Transfer of Care Note  Patient: Lori Thornton  Procedure(s) Performed: COLONOSCOPY WITH PROPOFOL  Patient Location: PACU  Anesthesia Type:General  Level of Consciousness: sedated  Airway & Oxygen Therapy: Patient Spontanous Breathing  Post-op Assessment: Report given to RN  Post vital signs: Reviewed and stable  Last Vitals:  Vitals Value Taken Time  BP    Temp    Pulse 75 03/07/24 1022  Resp 15 03/07/24 1022  SpO2 98 % 03/07/24 1022  Vitals shown include unfiled device data.  Last Pain:  Vitals:   03/07/24 0933  TempSrc: Temporal  PainSc: 0-No pain         Complications: No notable events documented.

## 2024-03-07 NOTE — Interval H&P Note (Signed)
 History and Physical Interval Note: Preprocedure H&P from 03/07/24  was reviewed and there was no interval change after seeing and examining the patient.  Written consent was obtained from the patient after discussion of risks, benefits, and alternatives. Patient has consented to proceed with Colonoscopy with possible intervention   03/07/2024 9:51 AM  Lori Thornton  has presented today for surgery, with the diagnosis of Z86.0101 (ICD-10-CM) - Hx of adenomatous colonic polyps.  The various methods of treatment have been discussed with the patient and family. After consideration of risks, benefits and other options for treatment, the patient has consented to  Procedure(s): COLONOSCOPY WITH PROPOFOL (N/A) as a surgical intervention.  The patient's history has been reviewed, patient examined, no change in status, stable for surgery.  I have reviewed the patient's chart and labs.  Questions were answered to the patient's satisfaction.     Jaynie Collins

## 2024-03-07 NOTE — Op Note (Signed)
 [facility] Patient Name: Lori Thornton Procedure Date: 03/07/2024 9:44 AM MRN: [id number] Account #: [id number] Date of Birth: 27-Jun-1946 Admit Type: Outpatient Age: 78 Room: [facility] ENDO ROOM 2 Gender: Female Note Status: Finalized Instrument Name: Peds Colonoscope [id number] Procedure:             Colonoscopy Indications:           High risk colon cancer surveillance: Personal history                         of colonic polyps Providers:             Jaynie Collins DO, DO Medicines:             Monitored Anesthesia Care Complications:         No immediate complications. Estimated blood loss:                         Minimal. Procedure:             Pre-Anesthesia Assessment:                        - Prior to the procedure, a History and Physical was                         performed, and patient medications and allergies were                         reviewed. The patient is competent. The risks and                         benefits of the procedure and the sedation options and                         risks were discussed with the patient. All questions                         were answered and informed consent was obtained.                         Patient identification and proposed procedure were                         verified by the physician, the nurse, the anesthetist                         and the technician in the endoscopy suite. Mental                         Status Examination: alert and oriented. Airway                         Examination: normal oropharyngeal airway and neck                         mobility. Respiratory Examination: clear to                         auscultation. CV Examination: RRR, no murmurs, no S3  or S4. Prophylactic Antibiotics: The patient does not                         require prophylactic antibiotics. Prior                         Anticoagulants: The patient has taken no anticoagulant                          or antiplatelet agents. ASA Grade Assessment: II - A                         patient with mild systemic disease. After reviewing                         the risks and benefits, the patient was deemed in                         satisfactory condition to undergo the procedure. The                         anesthesia plan was to use monitored anesthesia care                         (MAC). Immediately prior to administration of                         medications, the patient was re-assessed for adequacy                         to receive sedatives. The heart rate, respiratory                         rate, oxygen saturations, blood pressure, adequacy of                         pulmonary ventilation, and response to care were                         monitored throughout the procedure. The physical                         status of the patient was re-assessed after the                         procedure.                        After obtaining informed consent, the colonoscope was                         passed under direct vision. Throughout the procedure,                         the patient's blood pressure, pulse, and oxygen                         saturations were monitored continuously. The  Colonoscope was introduced through the anus and                         advanced to the the terminal ileum, with                         identification of the appendiceal orifice and IC                         valve. The colonoscopy was performed without                         difficulty. The patient tolerated the procedure well.                         The quality of the bowel preparation was evaluated                         using the BBPS [facility] Bowel Preparation Scale) with                         scores of: Right Colon = 3, Transverse Colon = 3 and                         Left Colon = 3 (entire mucosa seen well with no                         residual  staining, small fragments of stool or opaque                         liquid). The total BBPS score equals 9. The terminal                         ileum, ileocecal valve, appendiceal orifice, and                         rectum were photographed. Findings:      The perianal and digital rectal examinations were normal. Pertinent       negatives include normal sphincter tone.      The terminal ileum appeared normal. Estimated blood loss: none.      Retroflexion in the right colon was performed.      A 1 to 2 mm polyp was found in the descending colon. The polyp was       sessile. The polyp was removed with a jumbo cold forceps. Resection and       retrieval were complete. Estimated blood loss was minimal.      Non-bleeding internal hemorrhoids were found during retroflexion. The       hemorrhoids were Grade I (internal hemorrhoids that do not prolapse).       Estimated blood loss: none.      The exam was otherwise without abnormality on direct and retroflexion       views. Impression:            - The examined portion of the ileum was normal.                        - One 1 to 2  mm polyp in the descending colon, removed                         with a jumbo cold forceps. Resected and retrieved.                        - Non-bleeding internal hemorrhoids.                        - The examination was otherwise normal on direct and                         retroflexion views. Recommendation:        - Patient has a contact number available for                         emergencies. The signs and symptoms of potential                         delayed complications were discussed with the patient.                         Return to normal activities tomorrow. Written                         discharge instructions were provided to the patient.                        - Discharge patient to home.                        - Resume previous diet.                        - Continue present medications.                         - Await pathology results.                        - Repeat colonoscopy for surveillance based on                         pathology results.                        - Return to referring physician as previously                         scheduled.                        - The findings and recommendations were discussed with                         the patient. Procedure Code(s):     --- Professional ---                        [phone], Colonoscopy, flexible; with biopsy, single or  multiple Diagnosis Code(s):     --- Professional ---                        Z86.010, Personal history of colonic polyps                        K64.0, First degree hemorrhoids                        D12.4, Benign neoplasm of descending colon CPT copyright 2022 American Medical Association. All rights reserved. The codes documented in this report are preliminary and upon coder review may  be revised to meet current compliance requirements. Attending Participation:      I personally performed the entire procedure. Elfredia Nevins, DO Jaynie Collins DO, DO 03/07/2024 10:22:38 AM This report has been signed electronically. Number of Addenda: 0 Note Initiated On: 03/07/2024 9:44 AM Estimated Blood Loss:  Estimated blood loss was minimal.      [facility]

## 2024-03-07 NOTE — Anesthesia Postprocedure Evaluation (Signed)
 Anesthesia Post Note  Patient: Lori Thornton  Procedure(s) Performed: COLONOSCOPY WITH PROPOFOL  Patient location during evaluation: Endoscopy Anesthesia Type: General Level of consciousness: awake and alert Pain management: pain level controlled Vital Signs Assessment: post-procedure vital signs reviewed and stable Respiratory status: spontaneous breathing, nonlabored ventilation, respiratory function stable and patient connected to nasal cannula oxygen Cardiovascular status: blood pressure returned to baseline and stable Postop Assessment: no apparent nausea or vomiting Anesthetic complications: no  No notable events documented.   Last Vitals:  Vitals:   03/07/24 0933 03/07/24 1022  BP: (!) 143/77 110/63  Pulse: 69 76  Resp: 20 15  Temp: (!) 35.6 C (!) 36.3 C  SpO2: 100% 97%    Last Pain:  Vitals:   03/07/24 1022  TempSrc: Temporal  PainSc: 0-No pain                 Stephanie Coup

## 2024-03-07 NOTE — Anesthesia Preprocedure Evaluation (Signed)
 Anesthesia Evaluation  Patient identified by MRN, date of birth, ID band Patient awake    Reviewed: Allergy & Precautions, NPO status , Patient's Chart, lab work & pertinent test results, reviewed documented beta blocker date and time   Airway Mallampati: II  TM Distance: >3 FB Neck ROM: full    Dental  (+) Chipped   Pulmonary neg pulmonary ROS   Pulmonary exam normal        Cardiovascular negative cardio ROS Normal cardiovascular exam     Neuro/Psych negative neurological ROS  negative psych ROS   GI/Hepatic negative GI ROS, Neg liver ROS,,,  Endo/Other  negative endocrine ROS    Renal/GU negative Renal ROS  negative genitourinary   Musculoskeletal   Abdominal   Peds  Hematology negative hematology ROS (+)   Anesthesia Other Findings Past Medical History: 2009: Cancer (HCC)     Comment:  ovarian No date: Generalized osteoarthritis of hand No date: Hx of actinic keratosis No date: Osteopenia No date: Ovarian tumor of borderline malignancy, left No date: Pseudophakia of left eye No date: Pseudophakia of right eye  Past Surgical History: No date: ABDOMINAL HYSTERECTOMY 2014: BREAST BIOPSY; Right     Comment:  stereo bx. negative 1974 and 1985: BREAST EXCISIONAL BIOPSY; Right     Comment:  benign No date: CATARACT EXTRACTION; Right 11/19/2016: CHOLECYSTECTOMY; N/A     Comment:  Procedure: LAPAROSCOPIC CHOLECYSTECTOMY WITH               INTRAOPERATIVE CHOLANGIOGRAM;  Surgeon: Lattie Haw,              MD;  Location: [facility] ORS;  Service: General;  Laterality:               N/A; No date: COLONOSCOPY; N/A  BMI    Body Mass Index: 20.47 kg/m      Reproductive/Obstetrics negative OB ROS                             Anesthesia Physical Anesthesia Plan  ASA: 2  Anesthesia Plan: General   Post-op Pain Management: Minimal or no pain anticipated   Induction:  Intravenous  PONV Risk Score and Plan: 3 and Propofol infusion, TIVA and Ondansetron  Airway Management Planned: Nasal Cannula  Additional Equipment: None  Intra-op Plan:   Post-operative Plan:   Informed Consent: I have reviewed the patients History and Physical, chart, labs and discussed the procedure including the risks, benefits and alternatives for the proposed anesthesia with the patient or authorized representative who has indicated his/her understanding and acceptance.     Dental advisory given  Plan Discussed with: CRNA and Surgeon  Anesthesia Plan Comments: (Discussed risks of anesthesia with patient, including possibility of difficulty with spontaneous ventilation under anesthesia necessitating airway intervention, PONV, and rare risks such as cardiac or respiratory or neurological events, and allergic reactions. Discussed the role of CRNA in patient's perioperative care. Patient understands.)       Anesthesia Quick Evaluation

## 2024-03-07 NOTE — H&P (Signed)
 Pre-Procedure H&P   Patient ID: Lori Thornton is a 78 y.o. female.  Gastroenterology Provider: Jaynie Collins, DO  Referring Provider: Fransico Setters, NP PCP: Lauro Regulus, MD  Date: 03/07/2024  HPI Ms. Lori Thornton is a 78 y.o. female who presents today for Colonoscopy for personal history of colon polyps .  Patient denies any current GI symptoms.  No family history of colon cancer or colon polyps  Last underwent colonoscopy in October 2019 only with internal hemorrhoids.  2014 and 2006 colonoscopies had 1 tubular adenoma each.  An AVM and internal hemorrhoids were also appreciated in 2014  Status post cholecystectomy hysterectomy and appendectomy  Hemoglobin 14.8 MCV 91.5 platelets 244,000 creatinine 0.8   Past Medical History:  Diagnosis Date   Cancer (HCC) 2009   ovarian   Generalized osteoarthritis of hand    Hx of actinic keratosis    Osteopenia    Ovarian tumor of borderline malignancy, left    Pseudophakia of left eye    Pseudophakia of right eye     Past Surgical History:  Procedure Laterality Date   ABDOMINAL HYSTERECTOMY     BREAST BIOPSY Right 2014   stereo bx. negative   BREAST EXCISIONAL BIOPSY Right 1974 and 1985   benign   CATARACT EXTRACTION Right    CHOLECYSTECTOMY N/A 11/19/2016   Procedure: LAPAROSCOPIC CHOLECYSTECTOMY WITH INTRAOPERATIVE CHOLANGIOGRAM;  Surgeon: Lattie Haw, MD;  Location: [facility] ORS;  Service: General;  Laterality: N/A;   COLONOSCOPY N/A     Family History No h/o GI disease or malignancy  Review of Systems  Constitutional:  Negative for activity change, appetite change, chills, diaphoresis, fatigue, fever and unexpected weight change.  HENT:  Negative for trouble swallowing and voice change.   Respiratory:  Negative for shortness of breath and wheezing.   Cardiovascular:  Negative for chest pain, palpitations and leg swelling.  Gastrointestinal:  Negative for abdominal distention, abdominal pain, anal  bleeding, blood in stool, constipation, diarrhea, nausea, rectal pain and vomiting.  Musculoskeletal:  Negative for arthralgias and myalgias.  Skin:  Negative for color change and pallor.  Neurological:  Negative for dizziness, syncope and weakness.  Psychiatric/Behavioral:  Negative for confusion.   All other systems reviewed and are negative.    Medications No current facility-administered medications on file prior to encounter.   Current Outpatient Medications on File Prior to Encounter  Medication Sig Dispense Refill   acetaminophen (TYLENOL) 500 MG tablet Take 500 mg by mouth every 6 (six) hours as needed.     amoxicillin-clavulanate (AUGMENTIN) 875-125 MG tablet Take 1 tablet by mouth 2 (two) times daily. 20 tablet 1   calcium-vitamin D (OSCAL WITH D) 500-200 MG-UNIT tablet Take 1 tablet by mouth daily.     cetirizine (ZYRTEC) 10 MG tablet Take 10 mg by mouth daily.     Cetirizine HCl 10 MG CAPS Take 1 tablet by mouth daily as needed.     cholecalciferol (VITAMIN D3) 10 MCG (400 UNIT) TABS tablet Take 1,000 Units by mouth.     ciclopirox (LOPROX) 0.77 % cream Apply to feet once a week. 30 g 5   diclofenac (VOLTAREN) 0.1 % ophthalmic solution 4 (four) times daily.     ibuprofen (ADVIL,MOTRIN) 200 MG tablet Take 400 mg by mouth every 6 (six) hours as needed.     multivitamin (ONE-A-DAY MEN'S) TABS tablet Take 1 tablet by mouth daily.      Pertinent medications related to GI and procedure were reviewed  by me with the patient prior to the procedure   Current Facility-Administered Medications:    0.9 %  sodium chloride infusion, , Intravenous, Continuous, Jaynie Collins, DO, Last Rate: 20 mL/hr at 03/07/24 0945, 20 mL/hr at 03/07/24 0945  sodium chloride 20 mL/hr (03/07/24 0945)       No Known Allergies Allergies were reviewed by me prior to the procedure  Objective   Body mass index is 20.47 kg/m. Vitals:   03/07/24 0933  BP: (!) 143/77  Pulse: 69  Resp: 20   Temp: (!) 96 F (35.6 C)  TempSrc: Temporal  SpO2: 100%  Weight: 55.8 kg  Height: 5\' 5"  (1.651 m)     Physical Exam Vitals and nursing note reviewed.  Constitutional:      General: She is not in acute distress.    Appearance: Normal appearance. She is not ill-appearing, toxic-appearing or diaphoretic.  HENT:     Head: Normocephalic and atraumatic.     Nose: Nose normal.     Mouth/Throat:     Mouth: Mucous membranes are moist.     Pharynx: Oropharynx is clear.  Eyes:     General: No scleral icterus.    Extraocular Movements: Extraocular movements intact.  Cardiovascular:     Rate and Rhythm: Normal rate and regular rhythm.     Heart sounds: Normal heart sounds. No murmur heard.    No friction rub. No gallop.  Pulmonary:     Effort: Pulmonary effort is normal. No respiratory distress.     Breath sounds: Normal breath sounds. No wheezing, rhonchi or rales.  Abdominal:     General: Bowel sounds are normal. There is no distension.     Palpations: Abdomen is soft.     Tenderness: There is no abdominal tenderness. There is no guarding or rebound.  Musculoskeletal:     Cervical back: Neck supple.     Right lower leg: No edema.     Left lower leg: No edema.  Skin:    General: Skin is warm and dry.     Coloration: Skin is not jaundiced or pale.  Neurological:     General: No focal deficit present.     Mental Status: She is alert and oriented to person, place, and time. Mental status is at baseline.  Psychiatric:        Mood and Affect: Mood normal.        Behavior: Behavior normal.        Thought Content: Thought content normal.        Judgment: Judgment normal.      Assessment:  Lori Thornton is a 78 y.o. female  who presents today for Colonoscopy for personal history of colon polyps .  Plan:  Colonoscopy with possible intervention today  Colonoscopy with possible biopsy, control of bleeding, polypectomy, and interventions as necessary has been discussed with  the patient/patient representative. Informed consent was obtained from the patient/patient representative after explaining the indication, nature, and risks of the procedure including but not limited to death, bleeding, perforation, missed neoplasm/lesions, cardiorespiratory compromise, and reaction to medications. Opportunity for questions was given and appropriate answers were provided. Patient/patient representative has verbalized understanding is amenable to undergoing the procedure.   Jaynie Collins, DO  [facility]  Portions of the record may have been created with voice recognition software. Occasional wrong-word or 'sound-a-like' substitutions may have occurred due to the inherent limitations of voice recognition software.  Read the chart carefully and recognize,  using context, where substitutions may have occurred.

## 2024-03-08 LAB — SURGICAL PATHOLOGY

## 2024-08-25 ENCOUNTER — Encounter: Payer: Self-pay | Admitting: Dermatology

## 2024-08-25 ENCOUNTER — Ambulatory Visit (INDEPENDENT_AMBULATORY_CARE_PROVIDER_SITE_OTHER): Payer: Medicare HMO | Admitting: Dermatology

## 2024-08-25 DIAGNOSIS — L813 Cafe au lait spots: Secondary | ICD-10-CM

## 2024-08-25 DIAGNOSIS — L814 Other melanin hyperpigmentation: Secondary | ICD-10-CM

## 2024-08-25 DIAGNOSIS — D1801 Hemangioma of skin and subcutaneous tissue: Secondary | ICD-10-CM

## 2024-08-25 DIAGNOSIS — L578 Other skin changes due to chronic exposure to nonionizing radiation: Secondary | ICD-10-CM | POA: Diagnosis not present

## 2024-08-25 DIAGNOSIS — D229 Melanocytic nevi, unspecified: Secondary | ICD-10-CM

## 2024-08-25 DIAGNOSIS — W908XXA Exposure to other nonionizing radiation, initial encounter: Secondary | ICD-10-CM | POA: Diagnosis not present

## 2024-08-25 DIAGNOSIS — L570 Actinic keratosis: Secondary | ICD-10-CM | POA: Diagnosis not present

## 2024-08-25 DIAGNOSIS — Z1283 Encounter for screening for malignant neoplasm of skin: Secondary | ICD-10-CM | POA: Diagnosis not present

## 2024-08-25 DIAGNOSIS — B079 Viral wart, unspecified: Secondary | ICD-10-CM

## 2024-08-25 DIAGNOSIS — D485 Neoplasm of uncertain behavior of skin: Secondary | ICD-10-CM

## 2024-08-25 DIAGNOSIS — D3610 Benign neoplasm of peripheral nerves and autonomic nervous system, unspecified: Secondary | ICD-10-CM

## 2024-08-25 DIAGNOSIS — L821 Other seborrheic keratosis: Secondary | ICD-10-CM

## 2024-08-25 DIAGNOSIS — D492 Neoplasm of unspecified behavior of bone, soft tissue, and skin: Secondary | ICD-10-CM

## 2024-08-25 DIAGNOSIS — B351 Tinea unguium: Secondary | ICD-10-CM

## 2024-08-25 NOTE — Patient Instructions (Addendum)

## 2024-08-25 NOTE — Progress Notes (Signed)
 Follow-Up Visit   Subjective  Lori Thornton is a 78 y.o. female who presents for the following: Skin Cancer Screening and Full Body Skin Exam hx of Aks, check L 2nd toenail, nail is dark  The patient presents for Total-Body Skin Exam (TBSE) for skin cancer screening and mole check. The patient has spots, moles and lesions to be evaluated, some may be new or changing and the patient may have concern these could be cancer.    The following portions of the chart were reviewed this encounter and updated as appropriate: medications, allergies, medical history  Review of Systems:  No other skin or systemic complaints except as noted in HPI or Assessment and Plan.  Objective  Well appearing patient in no apparent distress; mood and affect are within normal limits.  A full examination was performed including scalp, head, eyes, ears, nose, lips, neck, chest, axillae, abdomen, back, buttocks, bilateral upper extremities, bilateral lower extremities, hands, feet, fingers, toes, fingernails, and toenails. All findings within normal limits unless otherwise noted below.   Exam of nails limited by presence of nail polish.   Relevant physical exam findings are noted in the Assessment and Plan.  L jawline x 1 Pink scaly macules R distal medial lower leg 6.33mm pink keratotic flat pap   Assessment & Plan   SKIN CANCER SCREENING PERFORMED TODAY.  ACTINIC DAMAGE - Chronic condition, secondary to cumulative UV/sun exposure - diffuse scaly erythematous macules with underlying dyspigmentation - Recommend daily broad spectrum sunscreen SPF 30+ to sun-exposed areas, reapply every 2 hours as needed.  - Staying in the shade or wearing long sleeves, sun glasses (UVA+UVB protection) and wide brim hats (4-inch brim around the entire circumference of the hat) are also recommended for sun protection.  - Call for new or changing lesions.  LENTIGINES, SEBORRHEIC KERATOSES, HEMANGIOMAS - Benign normal skin  lesions - Benign-appearing - Call for any changes  MELANOCYTIC NEVI - Tan-brown and/or pink-flesh-colored symmetric macules and papules - Benign appearing on exam today - Observation - Call clinic for new or changing moles - Recommend daily use of broad spectrum spf 30+ sunscreen to sun-exposed areas.   TINEA UNGUIUM vs OTHER L 2nd toenail Exam: green to black discoloration and subungual debris. Limited by nail polish  Treatment Plan: Discussed possible Tinea, pt defers treatment  NEUROFIBROMATOSIS (HCC) Trunk extremities Exam: soft fleshy paps. subQ large nodule L lateral thigh  Treatment Plan: Benign-appearing.  Observation.  Lesions unchanged per patient. Call clinic for new or changing lesions  AK (ACTINIC KERATOSIS) L jawline x 1 Actinic keratoses are precancerous spots that appear secondary to cumulative UV radiation exposure/sun exposure over time. They are chronic with expected duration over 1 year. A portion of actinic keratoses will progress to squamous cell carcinoma of the skin. It is not possible to reliably predict which spots will progress to skin cancer and so treatment is recommended to prevent development of skin cancer.  Recommend daily broad spectrum sunscreen SPF 30+ to sun-exposed areas, reapply every 2 hours as needed.  Recommend staying in the shade or wearing long sleeves, sun glasses (UVA+UVB protection) and wide brim hats (4-inch brim around the entire circumference of the hat). Call for new or changing lesions. Destruction of lesion - L jawline x 1 Complexity: simple   Destruction method: cryotherapy   Informed consent: discussed and consent obtained   Timeout:  patient name, date of birth, surgical site, and procedure verified Lesion destroyed using liquid nitrogen: Yes   Region frozen  until ice ball extended beyond lesion: Yes   Cryo cycles: 1 or 2. Outcome: patient tolerated procedure well with no complications   Post-procedure details: wound  care instructions given    NEOPLASM OF SKIN R distal medial lower leg Skin / nail biopsy Type of biopsy: tangential   Informed consent: discussed and consent obtained   Timeout: patient name, date of birth, surgical site, and procedure verified   Procedure prep:  Patient was prepped and draped in usual sterile fashion Prep type:  Isopropyl alcohol Anesthesia: the lesion was anesthetized in a standard fashion   Anesthetic:  1% lidocaine  w/ epinephrine  1-100,000 buffered w/ 8.4% NaHCO3 Instrument used: DermaBlade   Hemostasis achieved with: pressure and aluminum chloride   Outcome: patient tolerated procedure well   Post-procedure details: sterile dressing applied and wound care instructions given   Dressing type: bandage (mupirocin)    Specimen 1 - Surgical pathology Differential Diagnosis: ISK vs SCC  Check Margins: No 6.70mm pink keratotic flat pap LENTIGINES   ACTINIC ELASTOSIS   MULTIPLE BENIGN NEVI   SEBORRHEIC KERATOSES   CHERRY ANGIOMA   NEUROFIBROMA   CAFE AU LAIT SPOTS   ONYCHOMYCOSIS    Return in about 1 year (around 08/25/2025) for TBSE, Hx of AKs.  I, Grayce Saunas, RMA, am acting as scribe for Boneta Sharps, MD .   Documentation: I have reviewed the above documentation for accuracy and completeness, and I agree with the above.  Boneta Sharps, MD

## 2024-08-30 ENCOUNTER — Other Ambulatory Visit: Payer: Self-pay | Admitting: Internal Medicine

## 2024-08-30 DIAGNOSIS — Z1231 Encounter for screening mammogram for malignant neoplasm of breast: Secondary | ICD-10-CM

## 2024-08-30 LAB — SURGICAL PATHOLOGY

## 2024-08-31 ENCOUNTER — Ambulatory Visit: Payer: Self-pay | Admitting: Dermatology

## 2024-09-30 ENCOUNTER — Ambulatory Visit
Admission: RE | Admit: 2024-09-30 | Discharge: 2024-09-30 | Disposition: A | Discharge status: Home / Self Care | Source: Ambulatory Visit | Attending: Internal Medicine | Admitting: Internal Medicine

## 2024-09-30 DIAGNOSIS — Z1231 Encounter for screening mammogram for malignant neoplasm of breast: Secondary | ICD-10-CM | POA: Diagnosis present

## 2024-10-06 ENCOUNTER — Other Ambulatory Visit: Payer: Self-pay | Admitting: Internal Medicine

## 2024-10-06 DIAGNOSIS — R928 Other abnormal and inconclusive findings on diagnostic imaging of breast: Secondary | ICD-10-CM

## 2024-10-13 ENCOUNTER — Ambulatory Visit
Admission: RE | Admit: 2024-10-13 | Discharge: 2024-10-13 | Disposition: A | Discharge status: Home / Self Care | Source: Ambulatory Visit | Attending: Internal Medicine | Admitting: Internal Medicine

## 2024-10-13 DIAGNOSIS — R928 Other abnormal and inconclusive findings on diagnostic imaging of breast: Secondary | ICD-10-CM | POA: Insufficient documentation

## 2025-08-31 ENCOUNTER — Ambulatory Visit: Admitting: Dermatology
